# Patient Record
Sex: Female | Born: 1960 | Race: White | Hispanic: No | Marital: Married | Smoking: Never smoker
Health system: Southern US, Community
[De-identification: ages and names within clinical notes are randomized; demographics above are authoritative.]

## PROBLEM LIST (undated history)

## (undated) DIAGNOSIS — F419 Anxiety disorder, unspecified: Secondary | ICD-10-CM

## (undated) DIAGNOSIS — M1990 Unspecified osteoarthritis, unspecified site: Secondary | ICD-10-CM

## (undated) DIAGNOSIS — K219 Gastro-esophageal reflux disease without esophagitis: Secondary | ICD-10-CM

## (undated) DIAGNOSIS — G4730 Sleep apnea, unspecified: Secondary | ICD-10-CM

## (undated) DIAGNOSIS — T7840XA Allergy, unspecified, initial encounter: Secondary | ICD-10-CM

## (undated) DIAGNOSIS — I10 Essential (primary) hypertension: Secondary | ICD-10-CM

## (undated) DIAGNOSIS — F32A Depression, unspecified: Secondary | ICD-10-CM

## (undated) HISTORY — PX: ORIF DISTAL RADIUS FRACTURE: SUR927

## (undated) HISTORY — PX: WRIST FRACTURE SURGERY: SHX121

## (undated) HISTORY — DX: Anxiety disorder, unspecified: F41.9

## (undated) HISTORY — PX: HERNIA REPAIR: SHX51

## (undated) HISTORY — PX: OTHER SURGICAL HISTORY: SHX169

## (undated) HISTORY — PX: BUNIONECTOMY: SHX129

## (undated) HISTORY — PX: HYSTERECTOMY ABDOMINAL WITH SALPINGECTOMY: SHX6725

## (undated) HISTORY — DX: Allergy, unspecified, initial encounter: T78.40XA

## (undated) HISTORY — DX: Sleep apnea, unspecified: G47.30

---

## 2002-07-31 ENCOUNTER — Encounter: Admission: RE | Admit: 2002-07-31 | Discharge: 2002-07-31 | Payer: Self-pay | Admitting: Obstetrics and Gynecology

## 2002-07-31 ENCOUNTER — Encounter: Payer: Self-pay | Admitting: Obstetrics and Gynecology

## 2002-09-27 ENCOUNTER — Encounter: Payer: Self-pay | Admitting: Surgery

## 2002-09-27 ENCOUNTER — Encounter: Admission: RE | Admit: 2002-09-27 | Discharge: 2002-09-27 | Payer: Self-pay | Admitting: Surgery

## 2009-03-12 ENCOUNTER — Encounter: Admission: RE | Admit: 2009-03-12 | Discharge: 2009-03-12 | Payer: Self-pay | Admitting: Obstetrics and Gynecology

## 2010-03-14 ENCOUNTER — Encounter: Admission: RE | Admit: 2010-03-14 | Discharge: 2010-03-14 | Payer: Self-pay | Admitting: Obstetrics and Gynecology

## 2011-02-23 ENCOUNTER — Other Ambulatory Visit: Payer: Self-pay | Admitting: Obstetrics and Gynecology

## 2011-02-23 DIAGNOSIS — Z1231 Encounter for screening mammogram for malignant neoplasm of breast: Secondary | ICD-10-CM

## 2011-03-20 ENCOUNTER — Ambulatory Visit
Admission: RE | Admit: 2011-03-20 | Discharge: 2011-03-20 | Disposition: A | Discharge status: Home / Self Care | Payer: BC Managed Care – PPO | Source: Ambulatory Visit | Attending: Obstetrics and Gynecology | Admitting: Obstetrics and Gynecology

## 2011-03-20 DIAGNOSIS — Z1231 Encounter for screening mammogram for malignant neoplasm of breast: Secondary | ICD-10-CM

## 2012-02-26 ENCOUNTER — Other Ambulatory Visit: Payer: Self-pay | Admitting: Obstetrics and Gynecology

## 2012-02-26 DIAGNOSIS — Z1231 Encounter for screening mammogram for malignant neoplasm of breast: Secondary | ICD-10-CM

## 2012-03-24 ENCOUNTER — Ambulatory Visit
Admission: RE | Admit: 2012-03-24 | Discharge: 2012-03-24 | Disposition: A | Discharge status: Home / Self Care | Payer: BC Managed Care – PPO | Source: Ambulatory Visit | Attending: Obstetrics and Gynecology | Admitting: Obstetrics and Gynecology

## 2012-03-24 DIAGNOSIS — Z1231 Encounter for screening mammogram for malignant neoplasm of breast: Secondary | ICD-10-CM

## 2014-01-09 ENCOUNTER — Other Ambulatory Visit: Payer: Self-pay | Admitting: Family Medicine

## 2014-01-09 DIAGNOSIS — Z1231 Encounter for screening mammogram for malignant neoplasm of breast: Secondary | ICD-10-CM

## 2014-01-19 ENCOUNTER — Ambulatory Visit: Payer: BC Managed Care – PPO

## 2014-01-26 ENCOUNTER — Ambulatory Visit
Admission: RE | Admit: 2014-01-26 | Discharge: 2014-01-26 | Disposition: A | Discharge status: Home / Self Care | Payer: BC Managed Care – PPO | Source: Ambulatory Visit | Attending: Family Medicine | Admitting: Family Medicine

## 2014-01-26 ENCOUNTER — Other Ambulatory Visit: Payer: Self-pay

## 2014-01-26 ENCOUNTER — Ambulatory Visit
Admission: RE | Admit: 2014-01-26 | Discharge: 2014-01-26 | Disposition: A | Discharge status: Home / Self Care | Payer: BC Managed Care – PPO | Source: Ambulatory Visit

## 2014-01-26 DIAGNOSIS — Z1231 Encounter for screening mammogram for malignant neoplasm of breast: Secondary | ICD-10-CM

## 2018-10-27 ENCOUNTER — Other Ambulatory Visit: Payer: Self-pay | Admitting: Family Medicine

## 2018-10-27 DIAGNOSIS — Z1231 Encounter for screening mammogram for malignant neoplasm of breast: Secondary | ICD-10-CM

## 2018-12-13 ENCOUNTER — Ambulatory Visit
Admission: RE | Admit: 2018-12-13 | Discharge: 2018-12-13 | Disposition: A | Discharge status: Home / Self Care | Payer: BC Managed Care – PPO | Source: Ambulatory Visit | Attending: Family Medicine | Admitting: Family Medicine

## 2018-12-13 ENCOUNTER — Other Ambulatory Visit: Payer: Self-pay

## 2018-12-13 ENCOUNTER — Other Ambulatory Visit: Payer: Self-pay | Admitting: Family Medicine

## 2018-12-13 DIAGNOSIS — N63 Unspecified lump in unspecified breast: Secondary | ICD-10-CM

## 2018-12-13 DIAGNOSIS — Z1231 Encounter for screening mammogram for malignant neoplasm of breast: Secondary | ICD-10-CM

## 2018-12-22 ENCOUNTER — Other Ambulatory Visit: Payer: Self-pay

## 2018-12-22 ENCOUNTER — Ambulatory Visit: Payer: BC Managed Care – PPO

## 2018-12-22 ENCOUNTER — Ambulatory Visit
Admission: RE | Admit: 2018-12-22 | Discharge: 2018-12-22 | Disposition: A | Discharge status: Home / Self Care | Payer: BC Managed Care – PPO | Source: Ambulatory Visit | Attending: Family Medicine | Admitting: Family Medicine

## 2018-12-22 DIAGNOSIS — N63 Unspecified lump in unspecified breast: Secondary | ICD-10-CM

## 2019-11-28 ENCOUNTER — Other Ambulatory Visit: Payer: Self-pay | Admitting: Family Medicine

## 2019-11-28 DIAGNOSIS — Z1231 Encounter for screening mammogram for malignant neoplasm of breast: Secondary | ICD-10-CM

## 2019-12-25 ENCOUNTER — Ambulatory Visit: Payer: BC Managed Care – PPO

## 2020-01-03 ENCOUNTER — Other Ambulatory Visit: Payer: Self-pay

## 2020-01-03 ENCOUNTER — Ambulatory Visit
Admission: RE | Admit: 2020-01-03 | Discharge: 2020-01-03 | Disposition: A | Discharge status: Home / Self Care | Payer: BC Managed Care – PPO | Source: Ambulatory Visit | Attending: Family Medicine | Admitting: Family Medicine

## 2020-01-03 DIAGNOSIS — Z1231 Encounter for screening mammogram for malignant neoplasm of breast: Secondary | ICD-10-CM

## 2021-11-24 IMAGING — MG DIGITAL SCREENING BILAT W/ TOMO W/ CAD
8 series · 8 of 24 positions shown · non-contrast
Comparison: Previous exam(s).

CLINICAL DATA: Screening.

EXAM:
DIGITAL SCREENING BILATERAL MAMMOGRAM WITH TOMO AND CAD

[L MLO synth-2D]
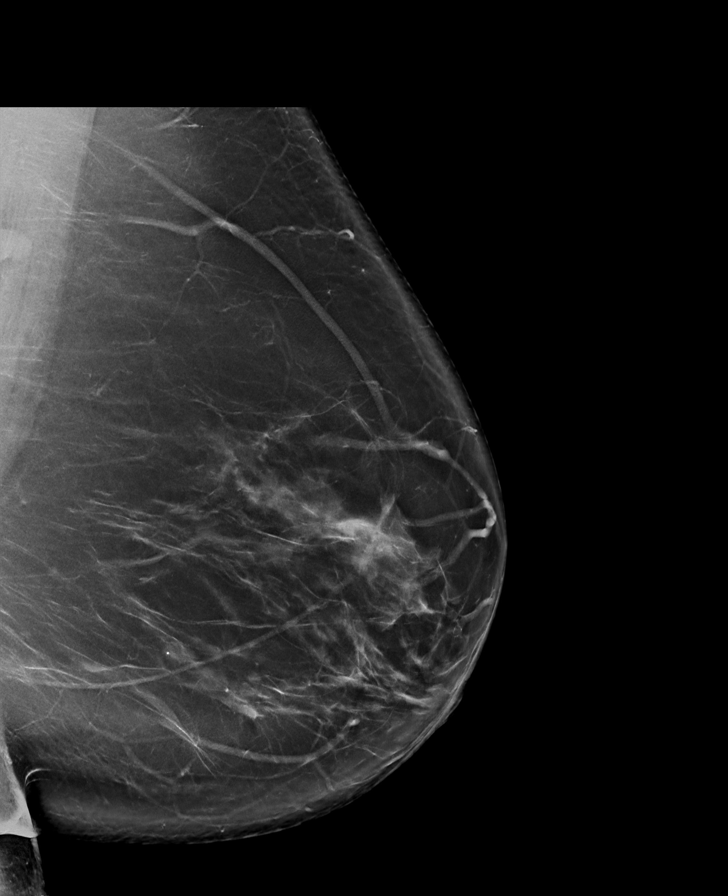

[R CC synth-2D]
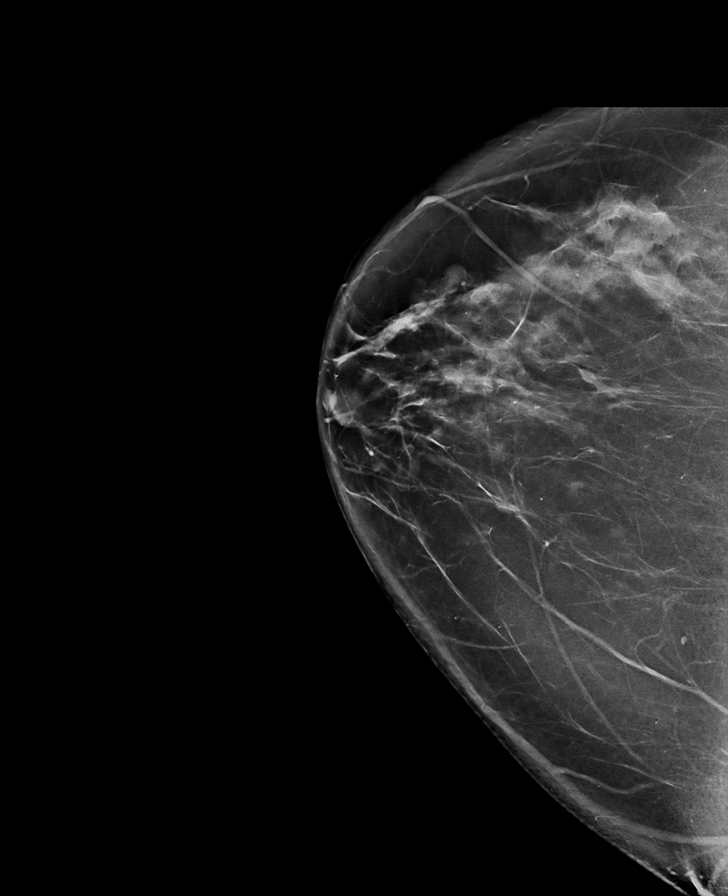

[R MLO synth-2D]
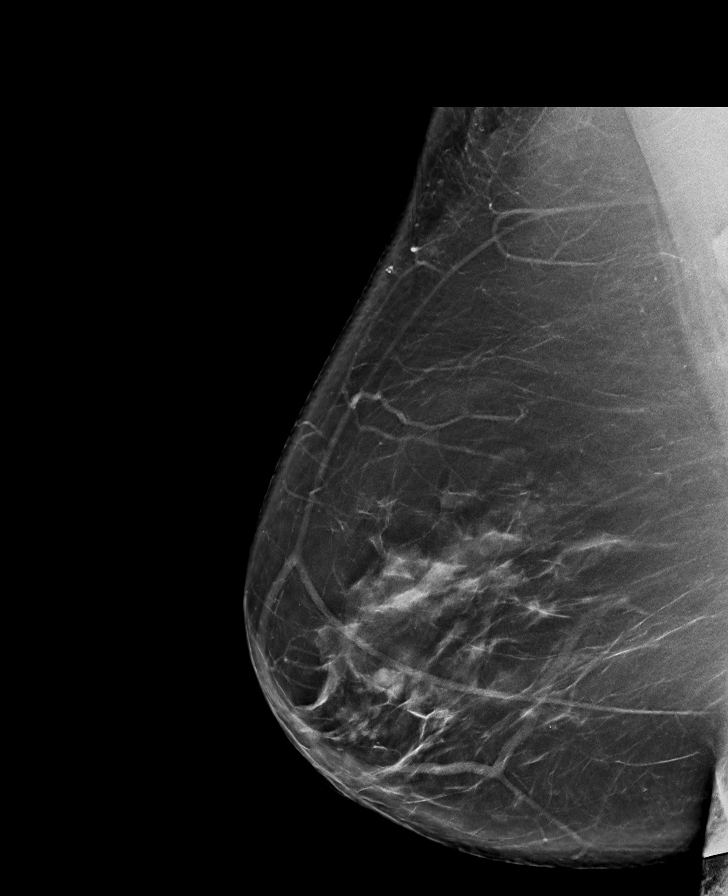

[L CC synth-2D]
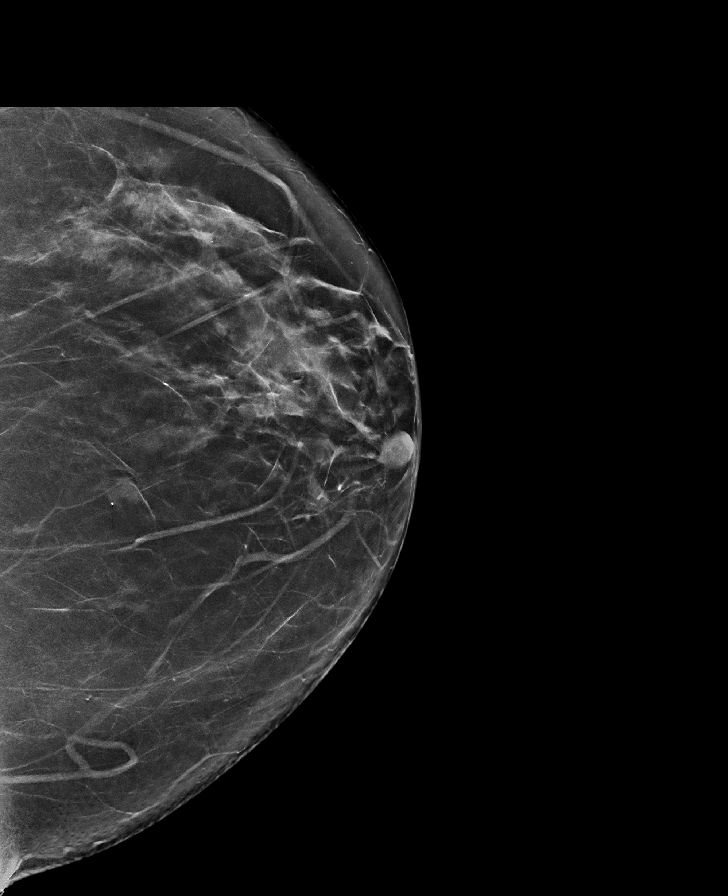

[L MLO tomo · tomo slice 51/101.0]
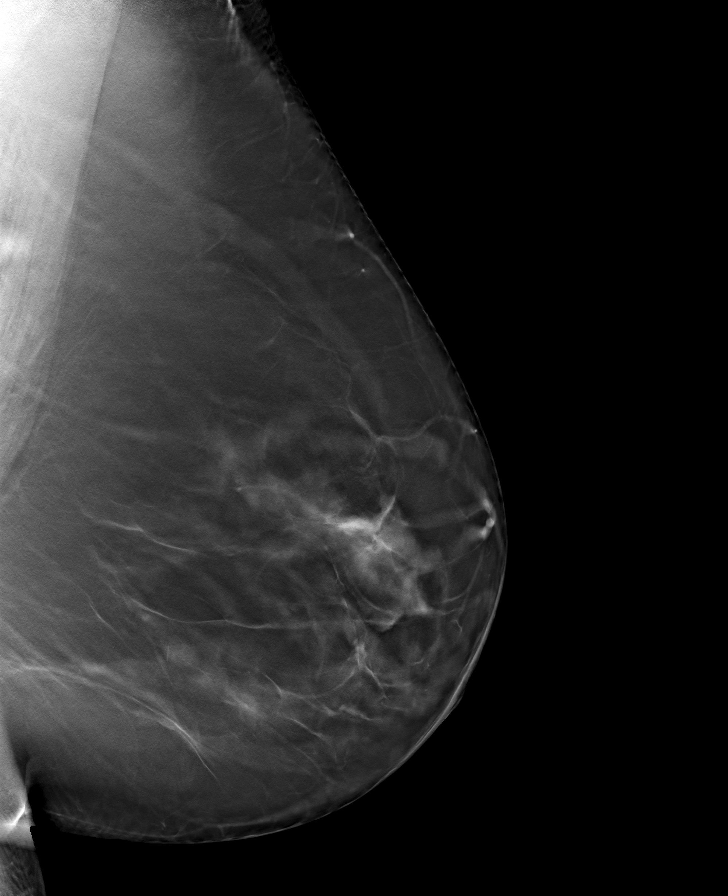

[L CC tomo · tomo slice 43/84.0]
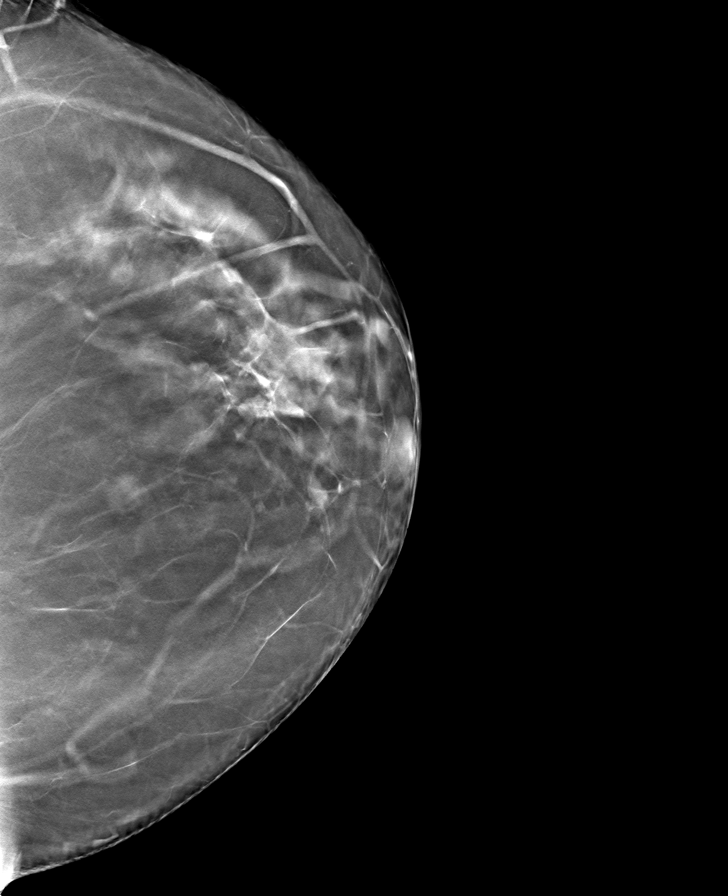

[R MLO tomo · tomo slice 51/100.0]
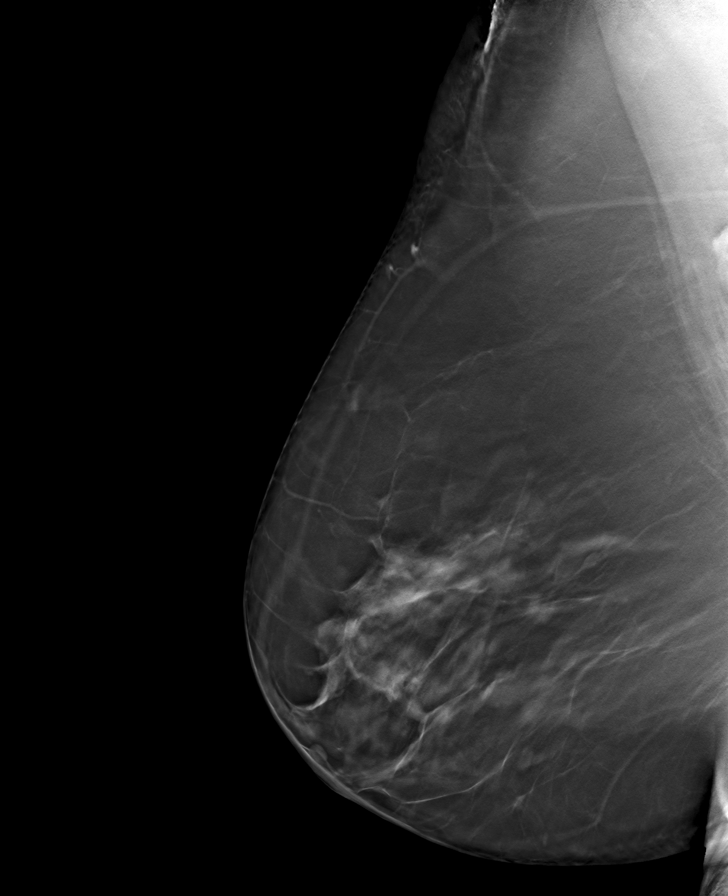

[R CC tomo · tomo slice 43/86.0]
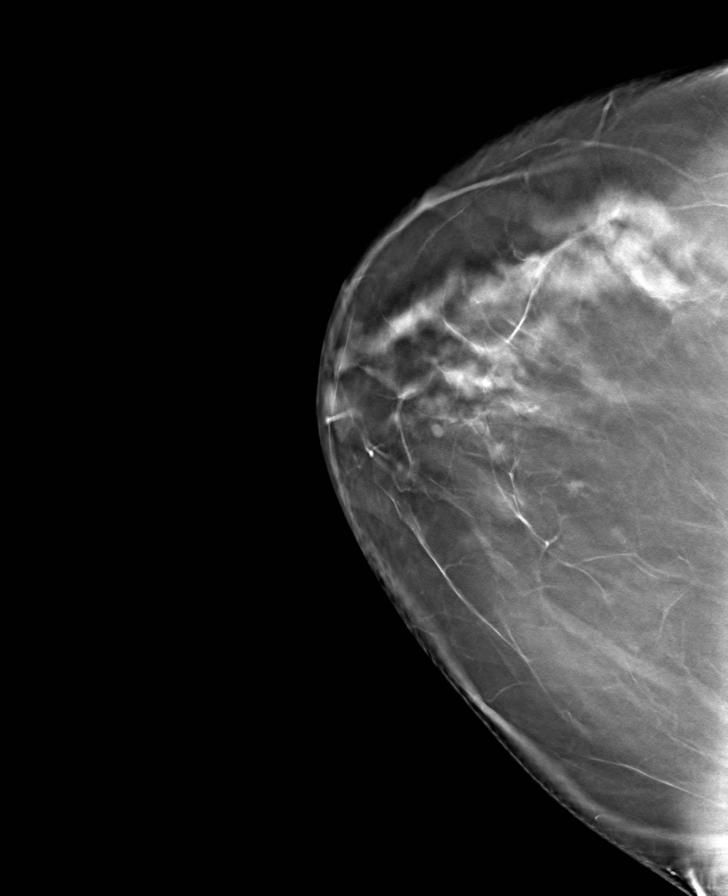

[8 of 24 positions shown; findings below may reference images not displayed]

ACR Breast Density Category c: The breast tissue is heterogeneously
dense, which may obscure small masses.
FINDINGS: There are no findings suspicious for malignancy. Images were
processed with CAD.
IMPRESSION: No mammographic evidence of malignancy. A result letter of this
screening mammogram will be mailed directly to the patient.

RECOMMENDATION:
Screening mammogram in one year. (Code:[id number]-[id number])

BI-RADS CATEGORY  1: Negative.

## 2022-02-25 DIAGNOSIS — R7301 Impaired fasting glucose: Secondary | ICD-10-CM | POA: Diagnosis not present

## 2022-02-25 DIAGNOSIS — L0390 Cellulitis, unspecified: Secondary | ICD-10-CM | POA: Diagnosis not present

## 2022-02-25 DIAGNOSIS — I10 Essential (primary) hypertension: Secondary | ICD-10-CM | POA: Diagnosis not present

## 2022-02-25 DIAGNOSIS — Z6841 Body Mass Index (BMI) 40.0 and over, adult: Secondary | ICD-10-CM | POA: Diagnosis not present

## 2022-02-25 DIAGNOSIS — F329 Major depressive disorder, single episode, unspecified: Secondary | ICD-10-CM | POA: Diagnosis not present

## 2022-02-25 DIAGNOSIS — E7800 Pure hypercholesterolemia, unspecified: Secondary | ICD-10-CM | POA: Diagnosis not present

## 2022-02-25 DIAGNOSIS — G2581 Restless legs syndrome: Secondary | ICD-10-CM | POA: Diagnosis not present

## 2022-04-01 DIAGNOSIS — K5900 Constipation, unspecified: Secondary | ICD-10-CM | POA: Diagnosis not present

## 2022-04-01 DIAGNOSIS — G2581 Restless legs syndrome: Secondary | ICD-10-CM | POA: Diagnosis not present

## 2022-04-01 DIAGNOSIS — F419 Anxiety disorder, unspecified: Secondary | ICD-10-CM | POA: Diagnosis not present

## 2022-04-01 DIAGNOSIS — I10 Essential (primary) hypertension: Secondary | ICD-10-CM | POA: Diagnosis not present

## 2022-04-01 DIAGNOSIS — F330 Major depressive disorder, recurrent, mild: Secondary | ICD-10-CM | POA: Diagnosis not present

## 2022-04-01 DIAGNOSIS — J309 Allergic rhinitis, unspecified: Secondary | ICD-10-CM | POA: Diagnosis not present

## 2022-04-01 DIAGNOSIS — G8929 Other chronic pain: Secondary | ICD-10-CM | POA: Diagnosis not present

## 2022-04-01 DIAGNOSIS — L0390 Cellulitis, unspecified: Secondary | ICD-10-CM | POA: Diagnosis not present

## 2022-04-01 DIAGNOSIS — K219 Gastro-esophageal reflux disease without esophagitis: Secondary | ICD-10-CM | POA: Diagnosis not present

## 2022-04-01 DIAGNOSIS — K589 Irritable bowel syndrome without diarrhea: Secondary | ICD-10-CM | POA: Diagnosis not present

## 2022-04-01 DIAGNOSIS — Z6841 Body Mass Index (BMI) 40.0 and over, adult: Secondary | ICD-10-CM | POA: Diagnosis not present

## 2022-04-02 DIAGNOSIS — L0390 Cellulitis, unspecified: Secondary | ICD-10-CM | POA: Diagnosis not present

## 2022-04-13 DIAGNOSIS — Z2821 Immunization not carried out because of patient refusal: Secondary | ICD-10-CM | POA: Diagnosis not present

## 2022-04-13 DIAGNOSIS — L0390 Cellulitis, unspecified: Secondary | ICD-10-CM | POA: Diagnosis not present

## 2022-04-14 DIAGNOSIS — M1711 Unilateral primary osteoarthritis, right knee: Secondary | ICD-10-CM | POA: Diagnosis not present

## 2022-04-22 DIAGNOSIS — J0100 Acute maxillary sinusitis, unspecified: Secondary | ICD-10-CM | POA: Diagnosis not present

## 2022-05-13 DIAGNOSIS — S60222A Contusion of left hand, initial encounter: Secondary | ICD-10-CM | POA: Diagnosis not present

## 2022-05-13 DIAGNOSIS — S52501A Unspecified fracture of the lower end of right radius, initial encounter for closed fracture: Secondary | ICD-10-CM | POA: Diagnosis not present

## 2022-05-21 DIAGNOSIS — S52501A Unspecified fracture of the lower end of right radius, initial encounter for closed fracture: Secondary | ICD-10-CM | POA: Diagnosis not present

## 2022-05-21 DIAGNOSIS — S52571A Other intraarticular fracture of lower end of right radius, initial encounter for closed fracture: Secondary | ICD-10-CM | POA: Diagnosis not present

## 2022-05-26 DIAGNOSIS — S60222A Contusion of left hand, initial encounter: Secondary | ICD-10-CM | POA: Diagnosis not present

## 2022-05-28 DIAGNOSIS — Z9889 Other specified postprocedural states: Secondary | ICD-10-CM | POA: Diagnosis not present

## 2022-05-28 DIAGNOSIS — G8918 Other acute postprocedural pain: Secondary | ICD-10-CM | POA: Diagnosis not present

## 2022-05-28 DIAGNOSIS — K219 Gastro-esophageal reflux disease without esophagitis: Secondary | ICD-10-CM | POA: Diagnosis not present

## 2022-05-28 DIAGNOSIS — I10 Essential (primary) hypertension: Secondary | ICD-10-CM | POA: Diagnosis not present

## 2022-05-28 DIAGNOSIS — S52571A Other intraarticular fracture of lower end of right radius, initial encounter for closed fracture: Secondary | ICD-10-CM | POA: Diagnosis not present

## 2022-05-28 DIAGNOSIS — S52591A Other fractures of lower end of right radius, initial encounter for closed fracture: Secondary | ICD-10-CM | POA: Diagnosis not present

## 2022-05-28 DIAGNOSIS — E669 Obesity, unspecified: Secondary | ICD-10-CM | POA: Diagnosis not present

## 2022-06-08 DIAGNOSIS — S52501A Unspecified fracture of the lower end of right radius, initial encounter for closed fracture: Secondary | ICD-10-CM | POA: Diagnosis not present

## 2022-06-10 DIAGNOSIS — Z6841 Body Mass Index (BMI) 40.0 and over, adult: Secondary | ICD-10-CM | POA: Diagnosis not present

## 2022-06-10 DIAGNOSIS — I10 Essential (primary) hypertension: Secondary | ICD-10-CM | POA: Diagnosis not present

## 2022-06-10 DIAGNOSIS — K219 Gastro-esophageal reflux disease without esophagitis: Secondary | ICD-10-CM | POA: Diagnosis not present

## 2022-06-10 DIAGNOSIS — E7800 Pure hypercholesterolemia, unspecified: Secondary | ICD-10-CM | POA: Diagnosis not present

## 2022-06-10 DIAGNOSIS — G2581 Restless legs syndrome: Secondary | ICD-10-CM | POA: Diagnosis not present

## 2022-06-10 DIAGNOSIS — F329 Major depressive disorder, single episode, unspecified: Secondary | ICD-10-CM | POA: Diagnosis not present

## 2022-07-06 DIAGNOSIS — S52501A Unspecified fracture of the lower end of right radius, initial encounter for closed fracture: Secondary | ICD-10-CM | POA: Diagnosis not present

## 2022-07-10 DIAGNOSIS — M189 Osteoarthritis of first carpometacarpal joint, unspecified: Secondary | ICD-10-CM | POA: Diagnosis not present

## 2022-07-10 DIAGNOSIS — S52591A Other fractures of lower end of right radius, initial encounter for closed fracture: Secondary | ICD-10-CM | POA: Diagnosis not present

## 2022-07-10 DIAGNOSIS — S52501A Unspecified fracture of the lower end of right radius, initial encounter for closed fracture: Secondary | ICD-10-CM | POA: Diagnosis not present

## 2022-08-05 DIAGNOSIS — Z1389 Encounter for screening for other disorder: Secondary | ICD-10-CM | POA: Diagnosis not present

## 2022-08-05 DIAGNOSIS — Z90710 Acquired absence of both cervix and uterus: Secondary | ICD-10-CM | POA: Diagnosis not present

## 2022-08-05 DIAGNOSIS — K760 Fatty (change of) liver, not elsewhere classified: Secondary | ICD-10-CM | POA: Diagnosis not present

## 2022-08-05 DIAGNOSIS — Z0000 Encounter for general adult medical examination without abnormal findings: Secondary | ICD-10-CM | POA: Diagnosis not present

## 2022-08-05 DIAGNOSIS — F330 Major depressive disorder, recurrent, mild: Secondary | ICD-10-CM | POA: Diagnosis not present

## 2022-08-05 DIAGNOSIS — Z6841 Body Mass Index (BMI) 40.0 and over, adult: Secondary | ICD-10-CM | POA: Diagnosis not present

## 2022-08-05 DIAGNOSIS — Z9181 History of falling: Secondary | ICD-10-CM | POA: Diagnosis not present

## 2022-08-05 DIAGNOSIS — R600 Localized edema: Secondary | ICD-10-CM | POA: Diagnosis not present

## 2022-08-05 DIAGNOSIS — Z2821 Immunization not carried out because of patient refusal: Secondary | ICD-10-CM | POA: Diagnosis not present

## 2022-08-06 ENCOUNTER — Other Ambulatory Visit: Payer: Self-pay | Admitting: Family Medicine

## 2022-08-06 DIAGNOSIS — Z1231 Encounter for screening mammogram for malignant neoplasm of breast: Secondary | ICD-10-CM

## 2022-08-07 ENCOUNTER — Ambulatory Visit
Admission: RE | Admit: 2022-08-07 | Discharge: 2022-08-07 | Disposition: A | Discharge status: Home / Self Care | Payer: BC Managed Care – PPO | Source: Ambulatory Visit | Attending: Family Medicine | Admitting: Family Medicine

## 2022-08-07 DIAGNOSIS — Z1231 Encounter for screening mammogram for malignant neoplasm of breast: Secondary | ICD-10-CM | POA: Diagnosis not present

## 2022-08-10 DIAGNOSIS — S52501A Unspecified fracture of the lower end of right radius, initial encounter for closed fracture: Secondary | ICD-10-CM | POA: Diagnosis not present

## 2022-08-17 DIAGNOSIS — M1711 Unilateral primary osteoarthritis, right knee: Secondary | ICD-10-CM | POA: Diagnosis not present

## 2022-08-19 DIAGNOSIS — S52601A Unspecified fracture of lower end of right ulna, initial encounter for closed fracture: Secondary | ICD-10-CM | POA: Diagnosis not present

## 2022-08-19 DIAGNOSIS — S52501A Unspecified fracture of the lower end of right radius, initial encounter for closed fracture: Secondary | ICD-10-CM | POA: Diagnosis not present

## 2022-08-27 ENCOUNTER — Encounter (HOSPITAL_BASED_OUTPATIENT_CLINIC_OR_DEPARTMENT_OTHER): Payer: Self-pay | Admitting: Orthopedic Surgery

## 2022-08-28 ENCOUNTER — Other Ambulatory Visit: Payer: Self-pay

## 2022-08-28 ENCOUNTER — Encounter (HOSPITAL_BASED_OUTPATIENT_CLINIC_OR_DEPARTMENT_OTHER): Payer: Self-pay | Admitting: Orthopedic Surgery

## 2022-08-31 ENCOUNTER — Encounter (HOSPITAL_BASED_OUTPATIENT_CLINIC_OR_DEPARTMENT_OTHER)
Admission: RE | Admit: 2022-08-31 | Discharge: 2022-08-31 | Disposition: A | Discharge status: Home / Self Care | Payer: PPO | Source: Ambulatory Visit | Attending: Orthopedic Surgery | Admitting: Orthopedic Surgery

## 2022-08-31 DIAGNOSIS — Z01810 Encounter for preprocedural cardiovascular examination: Secondary | ICD-10-CM | POA: Diagnosis not present

## 2022-08-31 DIAGNOSIS — I10 Essential (primary) hypertension: Secondary | ICD-10-CM | POA: Diagnosis not present

## 2022-08-31 NOTE — Progress Notes (Signed)

## 2022-09-01 ENCOUNTER — Other Ambulatory Visit: Payer: Self-pay | Admitting: Orthopedic Surgery

## 2022-09-03 ENCOUNTER — Encounter (HOSPITAL_BASED_OUTPATIENT_CLINIC_OR_DEPARTMENT_OTHER)
Admission: RE | Disposition: A | Discharge status: Home / Self Care | Payer: Self-pay | Source: Home / Self Care | Attending: Orthopedic Surgery

## 2022-09-03 ENCOUNTER — Ambulatory Visit (HOSPITAL_BASED_OUTPATIENT_CLINIC_OR_DEPARTMENT_OTHER)
Admission: RE | Admit: 2022-09-03 | Discharge: 2022-09-03 | Disposition: A | Discharge status: Home / Self Care | Payer: PPO | Attending: Orthopedic Surgery | Admitting: Orthopedic Surgery

## 2022-09-03 ENCOUNTER — Other Ambulatory Visit: Payer: Self-pay

## 2022-09-03 ENCOUNTER — Ambulatory Visit (HOSPITAL_BASED_OUTPATIENT_CLINIC_OR_DEPARTMENT_OTHER): Payer: PPO | Admitting: Anesthesiology

## 2022-09-03 ENCOUNTER — Encounter (HOSPITAL_BASED_OUTPATIENT_CLINIC_OR_DEPARTMENT_OTHER): Payer: Self-pay | Admitting: Orthopedic Surgery

## 2022-09-03 ENCOUNTER — Ambulatory Visit (HOSPITAL_BASED_OUTPATIENT_CLINIC_OR_DEPARTMENT_OTHER): Payer: PPO

## 2022-09-03 DIAGNOSIS — M1990 Unspecified osteoarthritis, unspecified site: Secondary | ICD-10-CM | POA: Diagnosis not present

## 2022-09-03 DIAGNOSIS — I10 Essential (primary) hypertension: Secondary | ICD-10-CM

## 2022-09-03 DIAGNOSIS — Z791 Long term (current) use of non-steroidal anti-inflammatories (NSAID): Secondary | ICD-10-CM | POA: Diagnosis not present

## 2022-09-03 DIAGNOSIS — S52501P Unspecified fracture of the lower end of right radius, subsequent encounter for closed fracture with malunion: Secondary | ICD-10-CM | POA: Diagnosis not present

## 2022-09-03 DIAGNOSIS — K219 Gastro-esophageal reflux disease without esophagitis: Secondary | ICD-10-CM | POA: Insufficient documentation

## 2022-09-03 DIAGNOSIS — X58XXXD Exposure to other specified factors, subsequent encounter: Secondary | ICD-10-CM | POA: Insufficient documentation

## 2022-09-03 DIAGNOSIS — S52501K Unspecified fracture of the lower end of right radius, subsequent encounter for closed fracture with nonunion: Secondary | ICD-10-CM

## 2022-09-03 DIAGNOSIS — Z79899 Other long term (current) drug therapy: Secondary | ICD-10-CM | POA: Insufficient documentation

## 2022-09-03 DIAGNOSIS — Z472 Encounter for removal of internal fixation device: Secondary | ICD-10-CM

## 2022-09-03 DIAGNOSIS — F32A Depression, unspecified: Secondary | ICD-10-CM | POA: Diagnosis not present

## 2022-09-03 DIAGNOSIS — S52571P Other intraarticular fracture of lower end of right radius, subsequent encounter for closed fracture with malunion: Secondary | ICD-10-CM | POA: Diagnosis not present

## 2022-09-03 HISTORY — PX: HARDWARE REMOVAL: SHX979

## 2022-09-03 HISTORY — DX: Unspecified osteoarthritis, unspecified site: M19.90

## 2022-09-03 HISTORY — PX: WRIST OSTEOTOMY: SHX1093

## 2022-09-03 HISTORY — DX: Gastro-esophageal reflux disease without esophagitis: K21.9

## 2022-09-03 HISTORY — DX: Depression, unspecified: F32.A

## 2022-09-03 HISTORY — DX: Essential (primary) hypertension: I10

## 2022-09-03 SURGERY — REMOVAL, HARDWARE
Anesthesia: Monitor Anesthesia Care | Site: Wrist | Laterality: Right

## 2022-09-03 MED ORDER — AMISULPRIDE (ANTIEMETIC) 5 MG/2ML IV SOLN: 10.0000 mg | Freq: Once | INTRAVENOUS | Status: DC | PRN

## 2022-09-03 MED ORDER — BUPIVACAINE HCL (PF) 0.25 % IJ SOLN
INTRAMUSCULAR | Status: AC
  Filled 2022-09-03: qty 60

## 2022-09-03 MED ORDER — OXYCODONE HCL 5 MG PO TABS: 5.0000 mg | ORAL_TABLET | Freq: Once | ORAL | Status: DC | PRN

## 2022-09-03 MED ORDER — LIDOCAINE 2% (20 MG/ML) 5 ML SYRINGE
INTRAMUSCULAR | Status: AC
  Filled 2022-09-03: qty 5

## 2022-09-03 MED ORDER — PROPOFOL 10 MG/ML IV BOLUS
INTRAVENOUS | Status: DC | PRN
  Administered 2022-09-03 (×2): 20 mg via INTRAVENOUS

## 2022-09-03 MED ORDER — FENTANYL CITRATE (PF) 100 MCG/2ML IJ SOLN
100.0000 ug | Freq: Once | INTRAMUSCULAR | Status: AC
  Administered 2022-09-03: 50 ug via INTRAVENOUS

## 2022-09-03 MED ORDER — OXYCODONE HCL 5 MG/5ML PO SOLN: 5.0000 mg | Freq: Once | ORAL | Status: DC | PRN

## 2022-09-03 MED ORDER — CLONIDINE HCL (ANALGESIA) 100 MCG/ML EP SOLN
EPIDURAL | Status: DC | PRN
  Administered 2022-09-03: 50 ug

## 2022-09-03 MED ORDER — FENTANYL CITRATE (PF) 100 MCG/2ML IJ SOLN: 25.0000 ug | INTRAMUSCULAR | Status: DC | PRN

## 2022-09-03 MED ORDER — CEFAZOLIN SODIUM-DEXTROSE 2-4 GM/100ML-% IV SOLN: 2.0000 g | INTRAVENOUS | Status: DC

## 2022-09-03 MED ORDER — FENTANYL CITRATE (PF) 100 MCG/2ML IJ SOLN
INTRAMUSCULAR | Status: AC
  Filled 2022-09-03: qty 2

## 2022-09-03 MED ORDER — ONDANSETRON HCL 4 MG/2ML IJ SOLN
INTRAMUSCULAR | Status: DC | PRN
  Administered 2022-09-03: 4 mg via INTRAVENOUS

## 2022-09-03 MED ORDER — OXYCODONE-ACETAMINOPHEN 5-325 MG PO TABS: ORAL_TABLET | ORAL | 0 refills | Status: DC

## 2022-09-03 MED ORDER — LACTATED RINGERS IV SOLN: INTRAVENOUS | Status: DC

## 2022-09-03 MED ORDER — 0.9 % SODIUM CHLORIDE (POUR BTL) OPTIME
TOPICAL | Status: DC | PRN
  Administered 2022-09-03: 1000 mL

## 2022-09-03 MED ORDER — ACETAMINOPHEN 10 MG/ML IV SOLN
INTRAVENOUS | Status: DC | PRN
  Administered 2022-09-03: 1000 mg via INTRAVENOUS

## 2022-09-03 MED ORDER — CEFAZOLIN SODIUM-DEXTROSE 2-4 GM/100ML-% IV SOLN
INTRAVENOUS | Status: AC
  Filled 2022-09-03: qty 100

## 2022-09-03 MED ORDER — ACETAMINOPHEN 500 MG PO TABS
ORAL_TABLET | ORAL | Status: AC
  Filled 2022-09-03: qty 2

## 2022-09-03 MED ORDER — MIDAZOLAM HCL 2 MG/2ML IJ SOLN
INTRAMUSCULAR | Status: AC
  Filled 2022-09-03: qty 2

## 2022-09-03 MED ORDER — MIDAZOLAM HCL 2 MG/2ML IJ SOLN
2.0000 mg | Freq: Once | INTRAMUSCULAR | Status: AC
  Administered 2022-09-03: 2 mg via INTRAVENOUS

## 2022-09-03 MED ORDER — PROPOFOL 500 MG/50ML IV EMUL
INTRAVENOUS | Status: DC | PRN
  Administered 2022-09-03: 75 ug/kg/min via INTRAVENOUS

## 2022-09-03 MED ORDER — BUPIVACAINE-EPINEPHRINE (PF) 0.5% -1:200000 IJ SOLN
INTRAMUSCULAR | Status: DC | PRN
  Administered 2022-09-03: 30 mL via PERINEURAL

## 2022-09-03 MED ORDER — LIDOCAINE 2% (20 MG/ML) 5 ML SYRINGE
INTRAMUSCULAR | Status: DC | PRN
  Administered 2022-09-03: 20 mg via INTRAVENOUS

## 2022-09-03 MED ORDER — ACETAMINOPHEN 10 MG/ML IV SOLN
INTRAVENOUS | Status: AC
  Filled 2022-09-03: qty 100

## 2022-09-03 MED ORDER — ONDANSETRON HCL 4 MG/2ML IJ SOLN
INTRAMUSCULAR | Status: AC
  Filled 2022-09-03: qty 2

## 2022-09-03 MED ORDER — DEXTROSE 5 % IV SOLN
INTRAVENOUS | Status: DC | PRN
  Administered 2022-09-03: 3 g via INTRAVENOUS

## 2022-09-03 MED ORDER — ACETAMINOPHEN 500 MG PO TABS: 1000.0000 mg | ORAL_TABLET | Freq: Once | ORAL | Status: DC

## 2022-09-03 SURGICAL SUPPLY — 62 items
APL PRP STRL LF DISP 70% ISPRP (MISCELLANEOUS) ×1
BIT DRILL 2.0 LNG QUCK RELEASE (BIT) IMPLANT
BIT DRILL QC 2.8X5 (BIT) IMPLANT
BLADE MINI RND TIP GREEN BEAV (BLADE) IMPLANT
BLADE SURG 15 STRL LF DISP TIS (BLADE) ×2 IMPLANT
BLADE SURG 15 STRL SS (BLADE) ×2
BNDG CMPR 5X2 KNTD ELC UNQ LF (GAUZE/BANDAGES/DRESSINGS)
BNDG CMPR 5X3 KNIT ELC UNQ LF (GAUZE/BANDAGES/DRESSINGS) ×1
BNDG CMPR 9X4 STRL LF SNTH (GAUZE/BANDAGES/DRESSINGS) ×1
BNDG COHESIVE 1X5 TAN STRL LF (GAUZE/BANDAGES/DRESSINGS) IMPLANT
BNDG ELASTIC 2INX 5YD STR LF (GAUZE/BANDAGES/DRESSINGS) IMPLANT
BNDG ELASTIC 3INX 5YD STR LF (GAUZE/BANDAGES/DRESSINGS) ×1 IMPLANT
BNDG ESMARK 4X9 LF (GAUZE/BANDAGES/DRESSINGS) IMPLANT
BNDG GAUZE DERMACEA FLUFF 4 (GAUZE/BANDAGES/DRESSINGS) ×1 IMPLANT
BNDG GZE DERMACEA 4 6PLY (GAUZE/BANDAGES/DRESSINGS) ×1
BONE CHIP PRESERV 5CC PCAN5 (Bone Implant) ×1 IMPLANT
CHLORAPREP W/TINT 26 (MISCELLANEOUS) ×1 IMPLANT
CORD BIPOLAR FORCEPS 12FT (ELECTRODE) ×1 IMPLANT
COVER BACK TABLE 60X90IN (DRAPES) ×1 IMPLANT
COVER MAYO STAND STRL (DRAPES) ×1 IMPLANT
CUFF TOURN SGL QUICK 18X4 (TOURNIQUET CUFF) ×1 IMPLANT
DRAPE EXTREMITY T 121X128X90 (DISPOSABLE) ×1 IMPLANT
DRAPE SURG 17X23 STRL (DRAPES) ×1 IMPLANT
DRILL 2.0 LNG QUICK RELEASE (BIT) ×1
GAUZE PAD ABD 8X10 STRL (GAUZE/BANDAGES/DRESSINGS) IMPLANT
GAUZE SPONGE 4X4 12PLY STRL (GAUZE/BANDAGES/DRESSINGS) ×1 IMPLANT
GAUZE XEROFORM 1X8 LF (GAUZE/BANDAGES/DRESSINGS) ×1 IMPLANT
GLOVE BIO SURGEON STRL SZ7.5 (GLOVE) ×1 IMPLANT
GLOVE BIOGEL PI IND STRL 8 (GLOVE) ×1 IMPLANT
GOWN STRL REUS W/ TWL LRG LVL3 (GOWN DISPOSABLE) ×1 IMPLANT
GOWN STRL REUS W/TWL LRG LVL3 (GOWN DISPOSABLE) ×2
GOWN STRL REUS W/TWL XL LVL3 (GOWN DISPOSABLE) ×1 IMPLANT
GRAFT BNE CANC CHIPS 1-8 5CC (Bone Implant) IMPLANT
GUIDEWIRE ORTHO 0.054X6 (WIRE) IMPLANT
NDL HYPO 25X1 1.5 SAFETY (NEEDLE) IMPLANT
NEEDLE HYPO 25X1 1.5 SAFETY (NEEDLE) IMPLANT
NS IRRIG 1000ML POUR BTL (IV SOLUTION) ×1 IMPLANT
PACK BASIN DAY SURGERY FS (CUSTOM PROCEDURE TRAY) ×1 IMPLANT
PAD CAST 3X4 CTTN HI CHSV (CAST SUPPLIES) IMPLANT
PADDING CAST ABS COTTON 4X4 ST (CAST SUPPLIES) ×1 IMPLANT
PADDING CAST COTTON 3X4 STRL (CAST SUPPLIES)
PLATE PROXIMAL VDU ACULOC (Plate) IMPLANT
SCREW CORT FT 18X2.3XLCK HEX (Screw) IMPLANT
SCREW CORT FT 20X2.3XLCK HEX (Screw) IMPLANT
SCREW CORTICAL LOCKING 2.3X18M (Screw) ×3 IMPLANT
SCREW CORTICAL LOCKING 2.3X20M (Screw) ×3 IMPLANT
SCREW FX18X2.3XSMTH LCK NS CRT (Screw) IMPLANT
SCREW FX20X2.3XSMTH LCK NS CRT (Screw) IMPLANT
SCREW NLCKG 13 3.5X13 HEXA (Screw) IMPLANT
SCREW NON-LOCK 3.5X13 (Screw) ×1 IMPLANT
SCREW NONLOCK HEX 3.5X12 (Screw) IMPLANT
SPIKE FLUID TRANSFER (MISCELLANEOUS) ×1 IMPLANT
SPLINT PLASTER CAST XFAST 3X15 (CAST SUPPLIES) IMPLANT
STOCKINETTE 4X48 STRL (DRAPES) ×1 IMPLANT
STRIP CLOSURE SKIN 1/4X4 (GAUZE/BANDAGES/DRESSINGS) IMPLANT
SUT ETHILON 4 0 PS 2 18 (SUTURE) ×1 IMPLANT
SUT MNCRL AB 4-0 PS2 18 (SUTURE) IMPLANT
SUT VIC AB 3-0 FS2 27 (SUTURE) IMPLANT
SYR BULB EAR ULCER 3OZ GRN STR (SYRINGE) ×1 IMPLANT
SYR CONTROL 10ML LL (SYRINGE) IMPLANT
TOWEL GREEN STERILE FF (TOWEL DISPOSABLE) ×2 IMPLANT
UNDERPAD 30X36 HEAVY ABSORB (UNDERPADS AND DIAPERS) ×1 IMPLANT

## 2022-09-03 NOTE — Transfer of Care (Signed)
Immediate Anesthesia Transfer of Care Note  Patient: Jessica Rosales  Procedure(s) Performed: HARDWARE REMOVAL RIGHT WRIST (Right: Wrist) OPENING WEDGE OSTEOTOMY RIGHT DISTAL RADIUS WITH BONE GRAFT AND PLATING (Right: Wrist)  Patient Location: PACU  Anesthesia Type:MAC combined with regional for post-op pain  Level of Consciousness: drowsy, patient cooperative, and responds to stimulation  Airway & Oxygen Therapy: Patient Spontanous Breathing and Patient connected to face mask oxygen  Post-op Assessment: Report given to RN and Post -op Vital signs reviewed and stable  Post vital signs: Reviewed and stable  Last Vitals:  Vitals Value Taken Time  BP    Temp    Pulse 65 09/03/22 1225  Resp    SpO2 98 % 09/03/22 1225  Vitals shown include unvalidated device data.  Last Pain:  Vitals:   09/03/22 0801  TempSrc: Tympanic  PainSc: 5       Patients Stated Pain Goal: 8 (09/03/22 0801)  Complications: No notable events documented.

## 2022-09-03 NOTE — Op Note (Signed)
I assisted Surgeon(s) and Role:    * Betha Loa, MD - Primary    * Cindee Salt, MD - Assisting on the Procedure(s): HARDWARE REMOVAL RIGHT WRIST OPENING WEDGE OSTEOTOMY RIGHT DISTAL RADIUS WITH BONE GRAFT AND PLATING on 09/03/2022.  I provided assistance on this case as follows: setup, approach, verification of the radial artery with protection release of the brachial radialis removal of the old volar plate and screws, osteotomy of the distal radius followed by reduction and fixation of the distal radius with placement of allograft, closure of the wound and application of the dressing and splint.  Electronically signed by: Cindee Salt, MD Date: 09/03/2022 Time: 12:20 PM

## 2022-09-03 NOTE — Anesthesia Postprocedure Evaluation (Signed)
Anesthesia Post Note  Patient: Jessica Rosales  Procedure(s) Performed: HARDWARE REMOVAL RIGHT WRIST (Right: Wrist) OPENING WEDGE OSTEOTOMY RIGHT DISTAL RADIUS WITH BONE GRAFT AND PLATING (Right: Wrist)     Patient location during evaluation: PACU Anesthesia Type: Regional Level of consciousness: awake and alert Pain management: pain level controlled Vital Signs Assessment: post-procedure vital signs reviewed and stable Respiratory status: spontaneous breathing, nonlabored ventilation, respiratory function stable and patient connected to nasal cannula oxygen Cardiovascular status: stable and blood pressure returned to baseline Postop Assessment: no apparent nausea or vomiting Anesthetic complications: no  No notable events documented.  Last Vitals:  Vitals:   09/03/22 1235 09/03/22 1240  BP: (!) 122/58 (!) 122/58  Pulse: 66 65  Resp: 15 15  Temp:    SpO2: 94% 94%    Last Pain:  Vitals:   09/03/22 1240  TempSrc:   PainSc: 0-No pain                 Kennieth Rad

## 2022-09-03 NOTE — Discharge Instructions (Addendum)

## 2022-09-03 NOTE — Anesthesia Procedure Notes (Addendum)
Anesthesia Regional Block: Axillary brachial plexus block   Pre-Anesthetic Checklist: , timeout performed,  Correct Patient, Correct Site, Correct Laterality,  Correct Procedure, Correct Position, site marked,  Risks and benefits discussed,  Surgical consent,  Pre-op evaluation,  At surgeon's request and post-op pain management  Laterality: Right  Prep: chloraprep       Needles:  Injection technique: Single-shot  Needle Type: Echogenic Stimulator Needle     Needle Length: 9cm  Needle Gauge: 21     Additional Needles:   Procedures:, nerve stimulator,,, ultrasound used (permanent image in chart),,     Nerve Stimulator or Paresthesia:  Response: MC, median, ulnar and radial responses elicited, 0.5 mA  Additional Responses:   Narrative:  Start time: 09/03/2022 9:03 AM End time: 09/03/2022 9:10 AM Injection made incrementally with aspirations every 5 mL.  Performed by: Personally  Anesthesiologist: Marcene Duos, MD

## 2022-09-03 NOTE — Anesthesia Preprocedure Evaluation (Addendum)
Anesthesia Evaluation  Patient identified by MRN, date of birth, ID band Patient awake    Reviewed: Allergy & Precautions, NPO status , Patient's Chart, lab work & pertinent test results  Airway Mallampati: II  TM Distance: >3 FB Neck ROM: Full    Dental   Pulmonary neg pulmonary ROS   Pulmonary exam normal        Cardiovascular hypertension, Pt. on medications  Rhythm:Regular Rate:Normal     Neuro/Psych negative neurological ROS     GI/Hepatic Neg liver ROS,GERD  ,,  Endo/Other  negative endocrine ROS    Renal/GU negative Renal ROS     Musculoskeletal  (+) Arthritis ,    Abdominal   Peds  Hematology negative hematology ROS (+)   Anesthesia Other Findings   Reproductive/Obstetrics                             Anesthesia Physical Anesthesia Plan  ASA: 3  Anesthesia Plan: Regional and MAC   Post-op Pain Management: Regional block*, Celebrex PO (pre-op)* and Ofirmev IV (intra-op)*   Induction:   PONV Risk Score and Plan: 2 and Propofol infusion and Ondansetron  Airway Management Planned: Natural Airway and Simple Face Mask  Additional Equipment:   Intra-op Plan:   Post-operative Plan:   Informed Consent: I have reviewed the patients History and Physical, chart, labs and discussed the procedure including the risks, benefits and alternatives for the proposed anesthesia with the patient or authorized representative who has indicated his/her understanding and acceptance.       Plan Discussed with: CRNA  Anesthesia Plan Comments:         Anesthesia Quick Evaluation

## 2022-09-03 NOTE — Anesthesia Procedure Notes (Signed)
Procedure Name: MAC Date/Time: 09/03/2022 10:17 AM  Performed by: Thornell Mule, CRNAOxygen Delivery Method: Simple face mask

## 2022-09-03 NOTE — OR Nursing (Signed)
Plate and 10 screws removed.

## 2022-09-03 NOTE — Progress Notes (Signed)
Assisted Dr. Rob Fitzgerald with right, axillary, ultrasound guided block. Side rails up, monitors on throughout procedure. See vital signs in flow sheet. Tolerated Procedure well. ?

## 2022-09-03 NOTE — H&P (Signed)
Jessica Rosales is an 62 y.o. female.   Chief Complaint: distal radius malunion HPI: 62 yo female 3 months s/p orif right distal radius with increased volar angulation and prominent hardware.  She wishes to proceed with removal hardware, osteotomy distal radius and repeat fixation.   Allergies: No Known Allergies  Past Medical History:  Diagnosis Date   Arthritis    Depression    GERD (gastroesophageal reflux disease)    Hypertension     Past Surgical History:  Procedure Laterality Date   BUNIONECTOMY     HERNIA REPAIR     HYSTERECTOMY ABDOMINAL WITH SALPINGECTOMY     ORIF DISTAL RADIUS FRACTURE     Total Knee     WRIST FRACTURE SURGERY      Family History: Family History  Problem Relation Age of Onset   Breast cancer Neg Hx     Social History:   reports that she has never smoked. She has never used smokeless tobacco. She reports that she does not drink alcohol and does not use drugs.  Medications: Medications Prior to Admission  Medication Sig Dispense Refill   celecoxib (CELEBREX) 200 MG capsule Take 200 mg by mouth daily.     fluticasone (FLONASE) 50 MCG/ACT nasal spray Place 1 spray into both nostrils daily.     lisinopril (ZESTRIL) 30 MG tablet Take 30 mg by mouth daily.     metoprolol succinate (TOPROL-XL) 25 MG 24 hr tablet Take 25 mg by mouth daily.     omeprazole (PRILOSEC) 20 MG capsule Take 20 mg by mouth daily.     oxycodone (OXY-IR) 5 MG capsule Take 5 mg by mouth every 4 (four) hours as needed.     pramipexole (MIRAPEX) 1 MG tablet Take 1 mg by mouth 3 (three) times daily.     vortioxetine HBr (TRINTELLIX) 20 MG TABS tablet Take 20 mg by mouth daily.     cyclobenzaprine (FLEXERIL) 10 MG tablet Take 10 mg by mouth 3 (three) times daily as needed for muscle spasms.      No results found for this or any previous visit (from the past 48 hour(s)).  No results found.    Blood pressure (!) 128/55, pulse 65, temperature (!) 97.4 F (36.3 C), temperature  source Tympanic, resp. rate 18, height 5\' 8"  (1.727 m), weight (!) 136.8 kg, SpO2 96 %.  General appearance: alert, cooperative, and appears stated age Head: Normocephalic, without obvious abnormality, atraumatic Neck: supple, symmetrical, trachea midline Extremities: Intact sensation and capillary refill all digits.  +epl/fpl/io.  No wounds.  Pulses: 2+ and symmetric Skin: Skin color, texture, turgor normal. 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  Plan removal hardware, osteotomy and repeat fixation with bone graft.  Non operative and operative treatment options have been discussed with the patient and patient wishes to proceed with operative treatment. Risks, benefits, and alternatives of surgery have been discussed and the patient agrees with the plan of care.   Betha Loa 09/03/2022, 8:36 AM

## 2022-09-03 NOTE — Op Note (Signed)
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 female states she sustained a fracture of her right distal radius at the end of December 2023.  She underwent open reduction internal fixation at outside facility in early January.  She has had continued pain and displacement of fracture on radiographs.  She wishes to proceed with removal of hardware with osteotomy of the distal radius and repeat fixation.  We discussed nonoperative and operative treatment options.  She wished to proceed with operative fixation.  Risks, benefits, and alternatives of surgery were discussed including the risk of blood loss; infection; damage to nerves, vessels, tendons, ligaments, bone; failure of surgery; need for additional surgery; complications with wound healing; continued pain; nonunion; malunion; stiffness.  We also discussed the possible need for bone graft and the benefits and risks including the possibility of disease transmission.  She voiced understanding of these risks and elected to proceed.    OPERATIVE COURSE:  After being identified preoperatively by myself, the patient and I agreed upon the procedure and site of procedure.  Surgical site was marked.   Surgical consent had been signed.  She was given IV Ancef as preoperative antibiotic  prophylaxis.  She was transferred to the operating room and placed on the operating room table in supine position with the right upper extremity on an armboard. Sedation was induced by the anesthesiologist.  A regional block had been performed by anesthesia in preoperative holding.  The right upper extremity was prepped and draped in normal sterile orthopedic fashion.  A surgical pause was performed between the surgeons, anesthesia and operating room staff, and all were in agreement as to the patient, procedure and site of procedure.  Tourniquet at the proximal aspect of the extremity was inflated to 250 mmHg after exsanguination of the limb with an Esmarch bandage.  Previous incision line was followed.  The bipolar electrocautery was used to obtain hemostasis.  The superficial and deep portions of the FCR tendon sheath were incised, and the FCR and FPL were swept ulnarly to protect the palmar cutaneous branch of the median nerve.  There was scarring in the deep tissues.  The brachioradialis was still intact and was released at the radial side of the radius.  The pronator quadratus was released and elevated with the periosteal elevator.  The fracture had healed at the majority of the distal radius on the radial side.  The ulnar sided lunate facet fragment had not healed.  This was intra-articular.  The lunate was displaced volarly and had been articulating with the distal aspect of the plate.  There is a defect in the articular surface of the volar aspect of the lunate.  The previous surgical hardware was removed with the screwdriver.  The C-arm was used to visualize the distal radius and plan the osteotomy.  There was increased volar flexion of the distal radius.  The rongeurs were used to remove  scar and prominent bone volarly at the radius.  Osteotomy was made through the proximal screw holes from the previous plate.  The unhealed lunate facet fragment was mobilized and soft tissue interposition and scarring  removed.  The distal radius was able to be brought into better alignment using a lamina spreader.  The C-arm was used in AP and lateral projections to ensure appropriate positioning which was the case.  The distal radius was able to be reduced so that the ulna was no longer positive.  An [employer] volar distal radial locking plate was selected.  It was secured to the bone with the guidepins.  C-arm was used in AP and lateral projections to ensure appropriate position of the hardware.  Standard AO drilling and measuring technique was used.  The distal holes were filled first at the radial side.  Locking screws were placed in the radial styloid holes.  The ulnar lunate facet fragment was reduced and compressed and the final 2 holes on the ulnar side of the plate were filled with locking pegs.  This was adequate to stabilize the distal radius and the lunate facet fragment.  The screw holes in the shaft of the plate were then filled with standard AO drilling and measuring technique.  Good purchase was obtained.  The wound was copiously irrigated with sterile saline.  Cancellous bone graft was then packed into the osteotomy site and at the nonunion site.  C-arm was used in AP and lateral projections to ensure appropriate reduction position of heart which was the case.  There is good articular congruity and the radial length and inclination and appropriate volar tilt have been restored.  Pronator quadratus was repaired back over top of the plate using 4-0 Vicryl suture.  Vicryl suture was placed in the subcutaneous tissues in an inverted interrupted fashion and the skin was closed with 4-0 nylon in a horizontal mattress fashion.  There was good pronation and supination of the wrist without crepitance.  The wound was then dressed with sterile Xeroform, 4x4s, and wrapped with a Kerlix bandage.  A volar splint was placed and wrapped with Kerlix and Ace bandage.  Tourniquet was deflated at approximately 90 minutes.  Fingertips  were pink with brisk capillary refill after deflation of the tourniquet.  Operative drapes were broken down.  The patient was awoken from anesthesia safely.  She was transferred back to the stretcher and taken to the PACU in stable condition.  I will see her back in the office in one week for postoperative followup.  I will give her a prescription for Percocet 5/325 1-2 tabs PO q6 hours prn pain, dispense # 25.    Betha Loa, MD Electronically signed, 09/03/22

## 2022-09-04 ENCOUNTER — Encounter (HOSPITAL_BASED_OUTPATIENT_CLINIC_OR_DEPARTMENT_OTHER): Payer: Self-pay | Admitting: Orthopedic Surgery

## 2022-09-07 DIAGNOSIS — S52571P Other intraarticular fracture of lower end of right radius, subsequent encounter for closed fracture with malunion: Secondary | ICD-10-CM | POA: Diagnosis not present

## 2022-09-09 DIAGNOSIS — Z23 Encounter for immunization: Secondary | ICD-10-CM | POA: Diagnosis not present

## 2022-09-09 DIAGNOSIS — G2581 Restless legs syndrome: Secondary | ICD-10-CM | POA: Diagnosis not present

## 2022-09-09 DIAGNOSIS — I10 Essential (primary) hypertension: Secondary | ICD-10-CM | POA: Diagnosis not present

## 2022-09-09 DIAGNOSIS — K219 Gastro-esophageal reflux disease without esophagitis: Secondary | ICD-10-CM | POA: Diagnosis not present

## 2022-09-09 DIAGNOSIS — E7800 Pure hypercholesterolemia, unspecified: Secondary | ICD-10-CM | POA: Diagnosis not present

## 2022-09-09 DIAGNOSIS — Z79899 Other long term (current) drug therapy: Secondary | ICD-10-CM | POA: Diagnosis not present

## 2022-09-09 DIAGNOSIS — F330 Major depressive disorder, recurrent, mild: Secondary | ICD-10-CM | POA: Diagnosis not present

## 2022-09-09 DIAGNOSIS — R32 Unspecified urinary incontinence: Secondary | ICD-10-CM | POA: Diagnosis not present

## 2022-09-09 DIAGNOSIS — D509 Iron deficiency anemia, unspecified: Secondary | ICD-10-CM | POA: Diagnosis not present

## 2022-09-09 DIAGNOSIS — Z1159 Encounter for screening for other viral diseases: Secondary | ICD-10-CM | POA: Diagnosis not present

## 2022-09-17 DIAGNOSIS — Z4802 Encounter for removal of sutures: Secondary | ICD-10-CM | POA: Diagnosis not present

## 2022-09-17 DIAGNOSIS — S52601A Unspecified fracture of lower end of right ulna, initial encounter for closed fracture: Secondary | ICD-10-CM | POA: Diagnosis not present

## 2022-09-17 DIAGNOSIS — S52501A Unspecified fracture of the lower end of right radius, initial encounter for closed fracture: Secondary | ICD-10-CM | POA: Diagnosis not present

## 2022-09-17 DIAGNOSIS — S52571P Other intraarticular fracture of lower end of right radius, subsequent encounter for closed fracture with malunion: Secondary | ICD-10-CM | POA: Diagnosis not present

## 2022-09-18 ENCOUNTER — Encounter: Payer: Self-pay | Admitting: Gastroenterology

## 2022-09-30 DIAGNOSIS — S52601A Unspecified fracture of lower end of right ulna, initial encounter for closed fracture: Secondary | ICD-10-CM | POA: Diagnosis not present

## 2022-09-30 DIAGNOSIS — S52571P Other intraarticular fracture of lower end of right radius, subsequent encounter for closed fracture with malunion: Secondary | ICD-10-CM | POA: Diagnosis not present

## 2022-09-30 DIAGNOSIS — S52501A Unspecified fracture of the lower end of right radius, initial encounter for closed fracture: Secondary | ICD-10-CM | POA: Diagnosis not present

## 2022-10-13 DIAGNOSIS — S52501A Unspecified fracture of the lower end of right radius, initial encounter for closed fracture: Secondary | ICD-10-CM | POA: Diagnosis not present

## 2022-10-13 DIAGNOSIS — S52571P Other intraarticular fracture of lower end of right radius, subsequent encounter for closed fracture with malunion: Secondary | ICD-10-CM | POA: Diagnosis not present

## 2022-10-13 DIAGNOSIS — M25631 Stiffness of right wrist, not elsewhere classified: Secondary | ICD-10-CM | POA: Diagnosis not present

## 2022-10-13 DIAGNOSIS — Z4889 Encounter for other specified surgical aftercare: Secondary | ICD-10-CM | POA: Diagnosis not present

## 2022-10-13 DIAGNOSIS — S52601A Unspecified fracture of lower end of right ulna, initial encounter for closed fracture: Secondary | ICD-10-CM | POA: Diagnosis not present

## 2022-10-26 ENCOUNTER — Ambulatory Visit (AMBULATORY_SURGERY_CENTER): Payer: PPO

## 2022-10-26 VITALS — Ht 68.0 in | Wt 300.0 lb

## 2022-10-26 DIAGNOSIS — Z1211 Encounter for screening for malignant neoplasm of colon: Secondary | ICD-10-CM

## 2022-10-26 MED ORDER — NA SULFATE-K SULFATE-MG SULF 17.5-3.13-1.6 GM/177ML PO SOLN: 1.0000 | Freq: Once | ORAL | 0 refills | Status: DC

## 2022-10-26 NOTE — Progress Notes (Signed)
No egg or soy allergy known to patient  No issues known to pt with past sedation with any surgeries or procedures Patient denies ever being told they had issues or difficulty with intubation  No FH of Malignant Hyperthermia Pt is not on diet pills Pt is not on  home 02  Pt is not on blood thinners  Pt denies issues with constipation  No A fib or A flutter Have any cardiac testing pending--no Pt instructed to use Single[employer]com or GoodRx for a price reduction on prep  Patient's chart reviewed by Cathlyn Parsons CNRA prior to previsit and patient appropriate for the [facility].  Previsit completed and red dot placed by patient's name on their procedure day (on provider's schedule).   Can ambulate with cane

## 2022-11-04 DIAGNOSIS — S52601A Unspecified fracture of lower end of right ulna, initial encounter for closed fracture: Secondary | ICD-10-CM | POA: Diagnosis not present

## 2022-11-04 DIAGNOSIS — Z4889 Encounter for other specified surgical aftercare: Secondary | ICD-10-CM | POA: Diagnosis not present

## 2022-11-04 DIAGNOSIS — S52501A Unspecified fracture of the lower end of right radius, initial encounter for closed fracture: Secondary | ICD-10-CM | POA: Diagnosis not present

## 2022-11-04 DIAGNOSIS — M25631 Stiffness of right wrist, not elsewhere classified: Secondary | ICD-10-CM | POA: Diagnosis not present

## 2022-11-04 DIAGNOSIS — S52571P Other intraarticular fracture of lower end of right radius, subsequent encounter for closed fracture with malunion: Secondary | ICD-10-CM | POA: Diagnosis not present

## 2022-11-06 ENCOUNTER — Encounter: Payer: Self-pay | Admitting: Gastroenterology

## 2022-11-23 ENCOUNTER — Other Ambulatory Visit: Payer: Self-pay

## 2022-11-23 ENCOUNTER — Telehealth: Payer: Self-pay | Admitting: Gastroenterology

## 2022-11-23 DIAGNOSIS — Z1211 Encounter for screening for malignant neoplasm of colon: Secondary | ICD-10-CM

## 2022-11-23 MED ORDER — NA SULFATE-K SULFATE-MG SULF 17.5-3.13-1.6 GM/177ML PO SOLN
1.0000 | Freq: Once | ORAL | 0 refills | Status: AC
Start: 2022-11-23 — End: 2022-11-23

## 2022-11-23 NOTE — Telephone Encounter (Signed)
New rx sent to preferred pharm. Patient made aware.

## 2022-11-23 NOTE — Telephone Encounter (Signed)
PT needs to  pickup Suprep but the pharmacy would not accept the script and today is when she starts the prep. It should be sent to [facility] in [place]

## 2022-11-24 ENCOUNTER — Ambulatory Visit (AMBULATORY_SURGERY_CENTER): Payer: PPO | Admitting: Gastroenterology

## 2022-11-24 ENCOUNTER — Encounter: Payer: Self-pay | Admitting: Gastroenterology

## 2022-11-24 VITALS — BP 115/57 | HR 76 | Temp 98.0°F | Resp 21 | Ht 68.0 in | Wt 300.0 lb

## 2022-11-24 DIAGNOSIS — Z1211 Encounter for screening for malignant neoplasm of colon: Secondary | ICD-10-CM

## 2022-11-24 DIAGNOSIS — F419 Anxiety disorder, unspecified: Secondary | ICD-10-CM | POA: Diagnosis not present

## 2022-11-24 DIAGNOSIS — F32A Depression, unspecified: Secondary | ICD-10-CM | POA: Diagnosis not present

## 2022-11-24 DIAGNOSIS — I10 Essential (primary) hypertension: Secondary | ICD-10-CM | POA: Diagnosis not present

## 2022-11-24 DIAGNOSIS — D120 Benign neoplasm of cecum: Secondary | ICD-10-CM | POA: Diagnosis not present

## 2022-11-24 MED ORDER — SODIUM CHLORIDE 0.9 % IV SOLN
500.0000 mL | Freq: Once | INTRAVENOUS | Status: DC
Start: 2022-11-24 — End: 2022-11-24

## 2022-11-24 NOTE — Progress Notes (Signed)
[facility] History and Physical   Primary Care Physician:  Alinda Deem, MD   Reason for Procedure:   CRC screening  Plan:    colon     HPI: Jessica Rosales is a 62 y.o. female    Past Medical History:  Diagnosis Date   Allergy    Anxiety    Arthritis    Depression    GERD (gastroesophageal reflux disease)    Hypertension     Past Surgical History:  Procedure Laterality Date   BUNIONECTOMY     HARDWARE REMOVAL Right 09/03/2022   Procedure: HARDWARE REMOVAL RIGHT WRIST;  Surgeon: Betha Loa, MD;  Location: [facility];  Service: Orthopedics;  Laterality: Right;  120 MIN   HERNIA REPAIR     HYSTERECTOMY ABDOMINAL WITH SALPINGECTOMY     ORIF DISTAL RADIUS FRACTURE     Total Knee     WRIST FRACTURE SURGERY     WRIST OSTEOTOMY Right 09/03/2022   Procedure: OPENING WEDGE OSTEOTOMY RIGHT DISTAL RADIUS WITH BONE GRAFT AND PLATING;  Surgeon: Betha Loa, MD;  Location: [facility];  Service: Orthopedics;  Laterality: Right;    Prior to Admission medications   Medication Sig Start Date End Date Taking? Authorizing Provider  celecoxib (CELEBREX) 200 MG capsule Take 200 mg by mouth daily.   Yes [provider]  celecoxib (CELEBREX) 200 MG capsule  02/22/20  Yes [provider]  fluticasone (FLONASE) 50 MCG/ACT nasal spray Place 1 spray into both nostrils daily.   Yes [provider]  hydrochlorothiazide (HYDRODIURIL) 25 MG tablet Take 25 mg by mouth daily.   Yes [provider]  lisinopril (ZESTRIL) 30 MG tablet Take 30 mg by mouth daily.   Yes [provider]  Magnesium Oxide (MAG-200 PO) Take by mouth.   Yes [provider]  metoprolol succinate (TOPROL-XL) 25 MG 24 hr tablet Take 25 mg by mouth daily.   Yes [provider]  omeprazole (PRILOSEC) 20 MG capsule Take 20 mg by mouth daily.   Yes [provider]  oxybutynin (DITROPAN-XL) 5 MG 24 hr tablet Take 1  tablet by mouth daily. 09/09/22  Yes [provider]  Potassium 99 MG TABS Take by mouth.   Yes [provider]  pramipexole (MIRAPEX) 1 MG tablet Take 1 mg by mouth 3 (three) times daily.   Yes [provider]  vortioxetine HBr (TRINTELLIX) 20 MG TABS tablet Take 20 mg by mouth daily.   Yes [provider]  cyclobenzaprine (FLEXERIL) 10 MG tablet Take 10 mg by mouth 3 (three) times daily as needed for muscle spasms.    [provider]  oxycodone (OXY-IR) 5 MG capsule Take 5 mg by mouth every 4 (four) hours as needed. Patient not taking: Reported on 10/26/2022    [provider]  oxyCODONE-acetaminophen (PERCOCET) 5-325 MG tablet 1-2 tabs PO q6 hours prn pain Patient not taking: Reported on 10/26/2022 09/03/22   Betha Loa, MD    Current Outpatient Medications  Medication Sig Dispense Refill   celecoxib (CELEBREX) 200 MG capsule Take 200 mg by mouth daily.     celecoxib (CELEBREX) 200 MG capsule      fluticasone (FLONASE) 50 MCG/ACT nasal spray Place 1 spray into both nostrils daily.     hydrochlorothiazide (HYDRODIURIL) 25 MG tablet Take 25 mg by mouth daily.     lisinopril (ZESTRIL) 30 MG tablet Take 30 mg by mouth daily.     Magnesium Oxide (MAG-200  PO) Take by mouth.     metoprolol succinate (TOPROL-XL) 25 MG 24 hr tablet Take 25 mg by mouth daily.     omeprazole (PRILOSEC) 20 MG capsule Take 20 mg by mouth daily.     oxybutynin (DITROPAN-XL) 5 MG 24 hr tablet Take 1 tablet by mouth daily.     Potassium 99 MG TABS Take by mouth.     pramipexole (MIRAPEX) 1 MG tablet Take 1 mg by mouth 3 (three) times daily.     vortioxetine HBr (TRINTELLIX) 20 MG TABS tablet Take 20 mg by mouth daily.     cyclobenzaprine (FLEXERIL) 10 MG tablet Take 10 mg by mouth 3 (three) times daily as needed for muscle spasms.     oxycodone (OXY-IR) 5 MG capsule Take 5 mg by mouth every 4 (four) hours as needed. (Patient not taking: Reported on 10/26/2022)      oxyCODONE-acetaminophen (PERCOCET) 5-325 MG tablet 1-2 tabs PO q6 hours prn pain (Patient not taking: Reported on 10/26/2022) 25 tablet 0   Current Facility-Administered Medications  Medication Dose Route Frequency Provider Last Rate Last Admin   0.9 %  sodium chloride infusion  500 mL Intravenous Once Lynann Bologna, MD        Allergies as of 11/24/2022   (No Known Allergies)    Family History  Problem Relation Age of Onset   Breast cancer Neg Hx    Colon polyps Neg Hx    Crohn's disease Neg Hx    Esophageal cancer Neg Hx    Rectal cancer Neg Hx    Stomach cancer Neg Hx     Social History   Socioeconomic History   Marital status: Married    Spouse name: Not on file   Number of children: Not on file   Years of education: Not on file   Highest education level: Not on file  Occupational History   Not on file  Tobacco Use   Smoking status: Never   Smokeless tobacco: Never  Vaping Use   Vaping Use: Never used  Substance and Sexual Activity   Alcohol use: Never   Drug use: Never   Sexual activity: Not on file  Other Topics Concern   Not on file  Social History Narrative   Not on file   Social Determinants of Health   Financial Resource Strain: Not on file  Food Insecurity: Not on file  Transportation Needs: Not on file  Physical Activity: Not on file  Stress: Not on file  Social Connections: Not on file  Intimate Partner Violence: Not on file    Review of Systems: Positive for none All other review of systems negative except as mentioned in the HPI.  Physical Exam: Vital signs in last 24 hours: @VSRANGES @   General:   Alert,  Well-developed, well-nourished, pleasant and cooperative in NAD Lungs:  Clear throughout to auscultation.   Heart:  Regular rate and rhythm; no murmurs, clicks, rubs,  or gallops. Abdomen:  Soft, nontender and nondistended. Normal bowel sounds.   Neuro/Psych:  Alert and cooperative. Normal mood and affect. A and O x 3    No  significant changes were identified.  The patient continues to be an appropriate candidate for the planned procedure and anesthesia.   Edman Circle, MD. [facility] 11/24/2022 8:03 AM@

## 2022-11-24 NOTE — Op Note (Signed)
[facility] Patient Name: Jessica Rosales Procedure Date: 11/24/2022 8:02 AM MRN: [id number] Endoscopist: Lynann Bologna , MD, [id number] Age: 62 Referring MD:  Date of Birth: [birth year] Gender: Female Account #: [id number] Procedure:                Colonoscopy Indications:              Screening for colorectal malignant neoplasm Medicines:                Monitored Anesthesia Care Procedure:                Pre-Anesthesia Assessment:                           - Prior to the procedure, a History and Physical                            was performed, and patient medications and                            allergies were reviewed. The patient's tolerance of                            previous anesthesia was also reviewed. The risks                            and benefits of the procedure and the sedation                            options and risks were discussed with the patient.                            All questions were answered, and informed consent                            was obtained. Prior Anticoagulants: The patient has                            taken no anticoagulant or antiplatelet agents. ASA                            Grade Assessment: II - A patient with mild systemic                            disease. After reviewing the risks and benefits,                            the patient was deemed in satisfactory condition to                            undergo the procedure.                           After obtaining informed consent, the colonoscope  was passed under direct vision. Throughout the                            procedure, the patient's blood pressure, pulse, and                            oxygen saturations were monitored continuously. The                            Olympus PCF Scope [id number] was introduced through                            the anus and advanced to the the cecum, identified                            by  appendiceal orifice and ileocecal valve. The                            colonoscopy was performed without difficulty. The                            patient tolerated the procedure well. The quality                            of the bowel preparation was good. The ileocecal                            valve, appendiceal orifice, and rectum were                            photographed. Scope In: 8:08:32 AM Scope Out: 8:20:33 AM Scope Withdrawal Time: 0 hours 9 minutes 22 seconds  Total Procedure Duration: 0 hours 12 minutes 1 second  Findings:                 A 6 mm polyp was found in the cecum. The polyp was                            sessile. The polyp was removed with a cold snare.                            Resection and retrieval were complete.                           There was a medium-sized lipoma, 10 mm in diameter,                            in the mid ascending colon.                           A few medium-mouthed diverticula were found in the                            sigmoid colon.  Non-bleeding internal hemorrhoids were found during                            retroflexion. The hemorrhoids were small and Grade                            I (internal hemorrhoids that do not prolapse).                           The exam was otherwise without abnormality on                            direct and retroflexion views. Complications:            No immediate complications. Estimated Blood Loss:     Estimated blood loss: none. Impression:               - One 6 mm polyp in the cecum, removed with a cold                            snare. Resected and retrieved.                           - Medium-sized lipoma in the mid ascending colon.                           - Mild sigmoid diverticulosis                           - Non-bleeding internal hemorrhoids.                           - The examination was otherwise normal on direct                            and  retroflexion views. Recommendation:           - Patient has a contact number available for                            emergencies. The signs and symptoms of potential                            delayed complications were discussed with the                            patient. Return to normal activities tomorrow.                            Written discharge instructions were provided to the                            patient.                           - High fiber diet.                           -  Continue present medications.                           - Await pathology results.                           - Repeat colonoscopy for surveillance based on                            pathology results.                           - The findings and recommendations were discussed                            with the patient's family. Lynann Bologna, MD 11/24/2022 8:25:19 AM This report has been signed electronically.

## 2022-11-24 NOTE — Progress Notes (Signed)
Called to room to assist during endoscopic procedure.  Patient ID and intended procedure confirmed with present staff. Received instructions for my participation in the procedure from the performing physician.  

## 2022-11-24 NOTE — Progress Notes (Signed)
Uneventful anesthetic. Report to pacu rn. Vss. Care resumed by rn. 

## 2022-11-24 NOTE — Progress Notes (Signed)
Pt's states no medical or surgical changes since previsit or office visit. 

## 2022-11-24 NOTE — Patient Instructions (Signed)
Thank you for letting us take care of your healthcare needs today. Please see handouts given to you on Polyps, Diverticulosis and Hemorrhoids.     YOU HAD AN ENDOSCOPIC PROCEDURE TODAY AT THE [facility]:   Refer to the procedure report that was given to you for any specific questions about what was found during the examination.  If the procedure report does not answer your questions, please call your gastroenterologist to clarify.  If you requested that your care partner not be given the details of your procedure findings, then the procedure report has been included in a sealed envelope for you to review at your convenience later.  YOU SHOULD EXPECT: Some feelings of bloating in the abdomen. Passage of more gas than usual.  Walking can help get rid of the air that was put into your GI tract during the procedure and reduce the bloating. If you had a lower endoscopy (such as a colonoscopy or flexible sigmoidoscopy) you may notice spotting of blood in your stool or on the toilet paper. If you underwent a bowel prep for your procedure, you may not have a normal bowel movement for a few days.  Please Note:  You might notice some irritation and congestion in your nose or some drainage.  This is from the oxygen used during your procedure.  There is no need for concern and it should clear up in a day or so.  SYMPTOMS TO REPORT IMMEDIATELY:  Following lower endoscopy (colonoscopy or flexible sigmoidoscopy):  Excessive amounts of blood in the stool  Significant tenderness or worsening of abdominal pains  Swelling of the abdomen that is new, acute  Fever of 100F or higher   For urgent or emergent issues, a gastroenterologist can be reached at any hour by calling [phone]. Do not use MyChart messaging for urgent concerns.    DIET:  We do recommend a small meal at first, but then you may proceed to your regular diet.  Drink plenty of fluids but you should avoid alcoholic beverages  for 24 hours.  ACTIVITY:  You should plan to take it easy for the rest of today and you should NOT DRIVE or use heavy machinery until tomorrow (because of the sedation medicines used during the test).    FOLLOW UP: Our staff will call the number listed on your records the next business day following your procedure.  We will call around 7:15- 8:00 am to check on you and address any questions or concerns that you may have regarding the information given to you following your procedure. If we do not reach you, we will leave a message.     If any biopsies were taken you will be contacted by phone or by letter within the next 1-3 weeks.  Please call us at [phone] if you have not heard about the biopsies in 3 weeks.    SIGNATURES/CONFIDENTIALITY: You and/or your care partner have signed paperwork which will be entered into your electronic medical record.  These signatures attest to the fact that that the information above on your After Visit Summary has been reviewed and is understood.  Full responsibility of the confidentiality of this discharge information lies with you and/or your care-partner. 

## 2022-11-25 ENCOUNTER — Telehealth: Payer: Self-pay

## 2022-11-25 DIAGNOSIS — S52571P Other intraarticular fracture of lower end of right radius, subsequent encounter for closed fracture with malunion: Secondary | ICD-10-CM | POA: Diagnosis not present

## 2022-11-25 NOTE — Telephone Encounter (Signed)
Post procedure follow up call, no answer 

## 2022-12-04 ENCOUNTER — Encounter: Payer: Self-pay | Admitting: Gastroenterology

## 2022-12-09 DIAGNOSIS — Z6841 Body Mass Index (BMI) 40.0 and over, adult: Secondary | ICD-10-CM | POA: Diagnosis not present

## 2022-12-09 DIAGNOSIS — I10 Essential (primary) hypertension: Secondary | ICD-10-CM | POA: Diagnosis not present

## 2022-12-09 DIAGNOSIS — G2581 Restless legs syndrome: Secondary | ICD-10-CM | POA: Diagnosis not present

## 2022-12-09 DIAGNOSIS — F330 Major depressive disorder, recurrent, mild: Secondary | ICD-10-CM | POA: Diagnosis not present

## 2022-12-09 DIAGNOSIS — K219 Gastro-esophageal reflux disease without esophagitis: Secondary | ICD-10-CM | POA: Diagnosis not present

## 2022-12-09 DIAGNOSIS — E7800 Pure hypercholesterolemia, unspecified: Secondary | ICD-10-CM | POA: Diagnosis not present

## 2022-12-21 DIAGNOSIS — M1711 Unilateral primary osteoarthritis, right knee: Secondary | ICD-10-CM | POA: Diagnosis not present

## 2022-12-21 DIAGNOSIS — M25461 Effusion, right knee: Secondary | ICD-10-CM | POA: Diagnosis not present

## 2023-01-15 DIAGNOSIS — M1711 Unilateral primary osteoarthritis, right knee: Secondary | ICD-10-CM | POA: Diagnosis not present

## 2023-01-18 DIAGNOSIS — L03119 Cellulitis of unspecified part of limb: Secondary | ICD-10-CM | POA: Diagnosis not present

## 2023-01-21 DIAGNOSIS — L03119 Cellulitis of unspecified part of limb: Secondary | ICD-10-CM | POA: Diagnosis not present

## 2023-01-26 DIAGNOSIS — L03119 Cellulitis of unspecified part of limb: Secondary | ICD-10-CM | POA: Diagnosis not present

## 2023-02-03 DIAGNOSIS — Z79899 Other long term (current) drug therapy: Secondary | ICD-10-CM | POA: Diagnosis not present

## 2023-02-05 DIAGNOSIS — J069 Acute upper respiratory infection, unspecified: Secondary | ICD-10-CM | POA: Diagnosis not present

## 2023-02-05 DIAGNOSIS — R051 Acute cough: Secondary | ICD-10-CM | POA: Diagnosis not present

## 2023-02-05 DIAGNOSIS — U071 COVID-19: Secondary | ICD-10-CM | POA: Diagnosis not present

## 2023-02-19 DIAGNOSIS — M1711 Unilateral primary osteoarthritis, right knee: Secondary | ICD-10-CM | POA: Diagnosis not present

## 2023-03-02 DIAGNOSIS — I10 Essential (primary) hypertension: Secondary | ICD-10-CM | POA: Diagnosis not present

## 2023-03-02 DIAGNOSIS — G8918 Other acute postprocedural pain: Secondary | ICD-10-CM | POA: Diagnosis not present

## 2023-03-02 DIAGNOSIS — M1711 Unilateral primary osteoarthritis, right knee: Secondary | ICD-10-CM | POA: Diagnosis not present

## 2023-03-02 DIAGNOSIS — K219 Gastro-esophageal reflux disease without esophagitis: Secondary | ICD-10-CM | POA: Diagnosis not present

## 2023-03-02 DIAGNOSIS — Z6841 Body Mass Index (BMI) 40.0 and over, adult: Secondary | ICD-10-CM | POA: Diagnosis not present

## 2023-03-08 DIAGNOSIS — I10 Essential (primary) hypertension: Secondary | ICD-10-CM | POA: Diagnosis not present

## 2023-03-08 DIAGNOSIS — Z7982 Long term (current) use of aspirin: Secondary | ICD-10-CM | POA: Diagnosis not present

## 2023-03-08 DIAGNOSIS — Z471 Aftercare following joint replacement surgery: Secondary | ICD-10-CM | POA: Diagnosis not present

## 2023-03-08 DIAGNOSIS — F419 Anxiety disorder, unspecified: Secondary | ICD-10-CM | POA: Diagnosis not present

## 2023-03-08 DIAGNOSIS — Z791 Long term (current) use of non-steroidal anti-inflammatories (NSAID): Secondary | ICD-10-CM | POA: Diagnosis not present

## 2023-03-08 DIAGNOSIS — Z6841 Body Mass Index (BMI) 40.0 and over, adult: Secondary | ICD-10-CM | POA: Diagnosis not present

## 2023-03-08 DIAGNOSIS — K589 Irritable bowel syndrome without diarrhea: Secondary | ICD-10-CM | POA: Diagnosis not present

## 2023-03-08 DIAGNOSIS — Z87891 Personal history of nicotine dependence: Secondary | ICD-10-CM | POA: Diagnosis not present

## 2023-03-08 DIAGNOSIS — K760 Fatty (change of) liver, not elsewhere classified: Secondary | ICD-10-CM | POA: Diagnosis not present

## 2023-03-08 DIAGNOSIS — E785 Hyperlipidemia, unspecified: Secondary | ICD-10-CM | POA: Diagnosis not present

## 2023-03-08 DIAGNOSIS — K219 Gastro-esophageal reflux disease without esophagitis: Secondary | ICD-10-CM | POA: Diagnosis not present

## 2023-03-08 DIAGNOSIS — F32A Depression, unspecified: Secondary | ICD-10-CM | POA: Diagnosis not present

## 2023-03-08 DIAGNOSIS — G2581 Restless legs syndrome: Secondary | ICD-10-CM | POA: Diagnosis not present

## 2023-03-08 DIAGNOSIS — Z96653 Presence of artificial knee joint, bilateral: Secondary | ICD-10-CM | POA: Diagnosis not present

## 2023-03-16 DIAGNOSIS — Z96651 Presence of right artificial knee joint: Secondary | ICD-10-CM | POA: Diagnosis not present

## 2023-03-16 DIAGNOSIS — Z471 Aftercare following joint replacement surgery: Secondary | ICD-10-CM | POA: Diagnosis not present

## 2023-03-22 DIAGNOSIS — M25561 Pain in right knee: Secondary | ICD-10-CM | POA: Diagnosis not present

## 2023-03-22 DIAGNOSIS — Z96651 Presence of right artificial knee joint: Secondary | ICD-10-CM | POA: Diagnosis not present

## 2023-03-22 DIAGNOSIS — R2689 Other abnormalities of gait and mobility: Secondary | ICD-10-CM | POA: Diagnosis not present

## 2023-03-23 DIAGNOSIS — M25561 Pain in right knee: Secondary | ICD-10-CM | POA: Diagnosis not present

## 2023-03-23 DIAGNOSIS — Z96651 Presence of right artificial knee joint: Secondary | ICD-10-CM | POA: Diagnosis not present

## 2023-03-23 DIAGNOSIS — R2689 Other abnormalities of gait and mobility: Secondary | ICD-10-CM | POA: Diagnosis not present

## 2023-03-24 DIAGNOSIS — F329 Major depressive disorder, single episode, unspecified: Secondary | ICD-10-CM | POA: Diagnosis not present

## 2023-03-24 DIAGNOSIS — I10 Essential (primary) hypertension: Secondary | ICD-10-CM | POA: Diagnosis not present

## 2023-03-24 DIAGNOSIS — E7800 Pure hypercholesterolemia, unspecified: Secondary | ICD-10-CM | POA: Diagnosis not present

## 2023-03-24 DIAGNOSIS — Z6841 Body Mass Index (BMI) 40.0 and over, adult: Secondary | ICD-10-CM | POA: Diagnosis not present

## 2023-03-24 DIAGNOSIS — R32 Unspecified urinary incontinence: Secondary | ICD-10-CM | POA: Diagnosis not present

## 2023-03-24 DIAGNOSIS — G2581 Restless legs syndrome: Secondary | ICD-10-CM | POA: Diagnosis not present

## 2023-03-29 DIAGNOSIS — R2689 Other abnormalities of gait and mobility: Secondary | ICD-10-CM | POA: Diagnosis not present

## 2023-03-29 DIAGNOSIS — Z96651 Presence of right artificial knee joint: Secondary | ICD-10-CM | POA: Diagnosis not present

## 2023-03-29 DIAGNOSIS — M25561 Pain in right knee: Secondary | ICD-10-CM | POA: Diagnosis not present

## 2023-04-01 DIAGNOSIS — M25561 Pain in right knee: Secondary | ICD-10-CM | POA: Diagnosis not present

## 2023-04-01 DIAGNOSIS — R2689 Other abnormalities of gait and mobility: Secondary | ICD-10-CM | POA: Diagnosis not present

## 2023-04-01 DIAGNOSIS — Z96651 Presence of right artificial knee joint: Secondary | ICD-10-CM | POA: Diagnosis not present

## 2023-04-05 DIAGNOSIS — M25561 Pain in right knee: Secondary | ICD-10-CM | POA: Diagnosis not present

## 2023-04-05 DIAGNOSIS — R2689 Other abnormalities of gait and mobility: Secondary | ICD-10-CM | POA: Diagnosis not present

## 2023-04-05 DIAGNOSIS — Z96651 Presence of right artificial knee joint: Secondary | ICD-10-CM | POA: Diagnosis not present

## 2023-04-08 DIAGNOSIS — R2689 Other abnormalities of gait and mobility: Secondary | ICD-10-CM | POA: Diagnosis not present

## 2023-04-08 DIAGNOSIS — Z96651 Presence of right artificial knee joint: Secondary | ICD-10-CM | POA: Diagnosis not present

## 2023-04-08 DIAGNOSIS — M25561 Pain in right knee: Secondary | ICD-10-CM | POA: Diagnosis not present

## 2023-04-12 DIAGNOSIS — Z96651 Presence of right artificial knee joint: Secondary | ICD-10-CM | POA: Diagnosis not present

## 2023-04-12 DIAGNOSIS — M25561 Pain in right knee: Secondary | ICD-10-CM | POA: Diagnosis not present

## 2023-04-12 DIAGNOSIS — R2689 Other abnormalities of gait and mobility: Secondary | ICD-10-CM | POA: Diagnosis not present

## 2023-04-19 DIAGNOSIS — M25561 Pain in right knee: Secondary | ICD-10-CM | POA: Diagnosis not present

## 2023-04-19 DIAGNOSIS — Z96651 Presence of right artificial knee joint: Secondary | ICD-10-CM | POA: Diagnosis not present

## 2023-04-19 DIAGNOSIS — R2689 Other abnormalities of gait and mobility: Secondary | ICD-10-CM | POA: Diagnosis not present

## 2023-05-27 DIAGNOSIS — M549 Dorsalgia, unspecified: Secondary | ICD-10-CM | POA: Diagnosis not present

## 2023-05-27 DIAGNOSIS — M47816 Spondylosis without myelopathy or radiculopathy, lumbar region: Secondary | ICD-10-CM | POA: Diagnosis not present

## 2023-06-07 DIAGNOSIS — R051 Acute cough: Secondary | ICD-10-CM | POA: Diagnosis not present

## 2023-06-07 DIAGNOSIS — J0100 Acute maxillary sinusitis, unspecified: Secondary | ICD-10-CM | POA: Diagnosis not present

## 2023-06-15 DIAGNOSIS — M47816 Spondylosis without myelopathy or radiculopathy, lumbar region: Secondary | ICD-10-CM | POA: Diagnosis not present

## 2023-07-19 DIAGNOSIS — L03119 Cellulitis of unspecified part of limb: Secondary | ICD-10-CM | POA: Diagnosis not present

## 2023-07-23 DIAGNOSIS — R32 Unspecified urinary incontinence: Secondary | ICD-10-CM | POA: Diagnosis not present

## 2023-07-23 DIAGNOSIS — I10 Essential (primary) hypertension: Secondary | ICD-10-CM | POA: Diagnosis not present

## 2023-07-23 DIAGNOSIS — K219 Gastro-esophageal reflux disease without esophagitis: Secondary | ICD-10-CM | POA: Diagnosis not present

## 2023-07-23 DIAGNOSIS — G2581 Restless legs syndrome: Secondary | ICD-10-CM | POA: Diagnosis not present

## 2023-07-23 DIAGNOSIS — L0390 Cellulitis, unspecified: Secondary | ICD-10-CM | POA: Diagnosis not present

## 2023-07-23 DIAGNOSIS — F329 Major depressive disorder, single episode, unspecified: Secondary | ICD-10-CM | POA: Diagnosis not present

## 2023-07-23 DIAGNOSIS — E7800 Pure hypercholesterolemia, unspecified: Secondary | ICD-10-CM | POA: Diagnosis not present

## 2023-07-24 LAB — LAB REPORT - SCANNED: EGFR: 87

## 2023-08-06 DIAGNOSIS — J0100 Acute maxillary sinusitis, unspecified: Secondary | ICD-10-CM | POA: Diagnosis not present

## 2023-08-11 DIAGNOSIS — J069 Acute upper respiratory infection, unspecified: Secondary | ICD-10-CM | POA: Diagnosis not present

## 2023-08-11 DIAGNOSIS — R509 Fever, unspecified: Secondary | ICD-10-CM | POA: Diagnosis not present

## 2023-09-23 ENCOUNTER — Ambulatory Visit: Admitting: Gastroenterology

## 2023-09-23 ENCOUNTER — Other Ambulatory Visit (INDEPENDENT_AMBULATORY_CARE_PROVIDER_SITE_OTHER)

## 2023-09-23 ENCOUNTER — Encounter: Payer: Self-pay | Admitting: Gastroenterology

## 2023-09-23 VITALS — BP 126/72 | HR 69 | Ht 68.0 in | Wt 318.0 lb

## 2023-09-23 DIAGNOSIS — K219 Gastro-esophageal reflux disease without esophagitis: Secondary | ICD-10-CM

## 2023-09-23 DIAGNOSIS — R160 Hepatomegaly, not elsewhere classified: Secondary | ICD-10-CM | POA: Diagnosis not present

## 2023-09-23 DIAGNOSIS — D509 Iron deficiency anemia, unspecified: Secondary | ICD-10-CM | POA: Diagnosis not present

## 2023-09-23 DIAGNOSIS — R1310 Dysphagia, unspecified: Secondary | ICD-10-CM | POA: Diagnosis not present

## 2023-09-23 DIAGNOSIS — R748 Abnormal levels of other serum enzymes: Secondary | ICD-10-CM | POA: Diagnosis not present

## 2023-09-23 LAB — CBC WITH DIFFERENTIAL/PLATELET
Basophils Absolute: 0.1 10*3/uL (ref 0.0–0.1)
Basophils Relative: 0.9 % (ref 0.0–3.0)
Eosinophils Absolute: 0.1 10*3/uL (ref 0.0–0.7)
Eosinophils Relative: 1.7 % (ref 0.0–5.0)
HCT: 31.8 % — ABNORMAL LOW (ref 36.0–46.0)
Hemoglobin: 10.2 g/dL — ABNORMAL LOW (ref 12.0–15.0)
Lymphocytes Relative: 21.9 % (ref 12.0–46.0)
Lymphs Abs: 1.8 10*3/uL (ref 0.7–4.0)
MCHC: 32 g/dL (ref 30.0–36.0)
MCV: 85.5 fl (ref 78.0–100.0)
Monocytes Absolute: 0.6 10*3/uL (ref 0.1–1.0)
Monocytes Relative: 7.6 % (ref 3.0–12.0)
Neutro Abs: 5.6 10*3/uL (ref 1.4–7.7)
Neutrophils Relative %: 67.9 % (ref 43.0–77.0)
Platelets: 365 10*3/uL (ref 150.0–400.0)
RBC: 3.72 Mil/uL — ABNORMAL LOW (ref 3.87–5.11)
RDW: 16.9 % — ABNORMAL HIGH (ref 11.5–15.5)
WBC: 8.3 10*3/uL (ref 4.0–10.5)

## 2023-09-23 LAB — COMPREHENSIVE METABOLIC PANEL WITH GFR
ALT: 17 U/L (ref 0–35)
AST: 21 U/L (ref 0–37)
Albumin: 3.8 g/dL (ref 3.5–5.2)
Alkaline Phosphatase: 182 U/L — ABNORMAL HIGH (ref 39–117)
BUN: 15 mg/dL (ref 6–23)
CO2: 30 meq/L (ref 19–32)
Calcium: 9.3 mg/dL (ref 8.4–10.5)
Chloride: 99 meq/L (ref 96–112)
Creatinine, Ser: 0.79 mg/dL (ref 0.40–1.20)
GFR: 80.19 mL/min (ref >60.00)
Glucose, Bld: 90 mg/dL (ref 70–99)
Potassium: 4.1 meq/L (ref 3.5–5.1)
Sodium: 135 meq/L (ref 135–145)
Total Bilirubin: 0.4 mg/dL (ref 0.2–1.2)
Total Protein: 8.1 g/dL (ref 6.0–8.3)

## 2023-09-23 LAB — IBC + FERRITIN
Ferritin: 13.8 ng/mL (ref 10.0–291.0)
Iron: 39 ug/dL — ABNORMAL LOW (ref 42–145)
Saturation Ratios: 9.3 % — ABNORMAL LOW (ref 20.0–50.0)
TIBC: 420 ug/dL (ref 250.0–450.0)
Transferrin: 300 mg/dL (ref 212.0–360.0)

## 2023-09-23 MED ORDER — OMEPRAZOLE 20 MG PO CPDR: 20.0000 mg | DELAYED_RELEASE_CAPSULE | Freq: Two times a day (BID) | ORAL | 5 refills | Status: AC

## 2023-09-23 NOTE — Patient Instructions (Addendum)
 _______________________________________________________  If your blood pressure at your visit was 140/90 or greater, please contact your primary care physician to follow up on this.  _______________________________________________________  If you are age 63 or older, your body mass index should be between 23-30. Your Body mass index is 48.35 kg/m. If this is out of the aforementioned range listed, please consider follow up with your Primary Care Provider.  If you are age 77 or younger, your body mass index should be between 19-25. Your Body mass index is 48.35 kg/m. If this is out of the aformentioned range listed, please consider follow up with your Primary Care Provider.   ________________________________________________________  The  GI providers would like to encourage you to use MYCHART to communicate with providers for non-urgent requests or questions.  Due to long hold times on the telephone, sending your provider a message by [facility] may be a faster and more efficient way to get a response.  Please allow 48 business hours for a response.  Please remember that this is for non-urgent requests.  _______________________________________________________  Your provider has requested that you go to the basement level for lab work before leaving today. Press "B" on the elevator. The lab is located at the first door on the left as you exit the elevator.  We have sent the following medications to your pharmacy for you to pick up at your convenience: Omeprazole  20mg  2 times a day   You have been scheduled for a CT scan of the abdomen and pelvis at [facility] [place], [place], [place] [address]).   You are scheduled on 09-29-23 at 430pm. You should arrive by 215pm your appointment time for registration. Please follow the written instructions below on the day of your exam:  WARNING: IF YOU ARE ALLERGIC TO IODINE/X-RAY DYE, PLEASE NOTIFY RADIOLOGY IMMEDIATELY AT [phone]!  YOU WILL BE GIVEN A 13 HOUR PREMEDICATION PREP.  1) Do not eat or drink anything after 1230pm (4 hours prior to your test) 2) You will be given 2 bottles of oral contrast to drink on site.    Drink 1 bottle of contrast @ 230pm (2 hours prior to your exam)  Drink 1 bottle of contrast @  330pm (1 hour prior to your exam)  You may take any medications as prescribed with a small amount of water, if necessary. If you take any of the following medications: METFORMIN, GLUCOPHAGE, GLUCOVANCE, AVANDAMET, RIOMET, FORTAMET, ACTOPLUS MET, JANUMET, GLUMETZA or METAGLIP, you MAY be asked to HOLD this medication 48 hours AFTER the exam.  The purpose of you drinking the oral contrast is to aid in the visualization of your intestinal tract. The contrast solution may cause some diarrhea. Depending on your individual set of symptoms, you may also receive an intravenous injection of x-ray contrast/dye. Plan on being at [facility] for 30 minutes or longer, depending on the type of exam you are having performed.  This test typically takes 30-45 minutes to complete.  If you have any questions regarding your exam or if you need to reschedule, you may call the CT department at [phone] between the hours of 8:00 am and 5:00 pm, Monday-Friday.  You have been scheduled for an endoscopy. Please follow written instructions given to you at your visit today.  If you use inhalers (even only as needed), please bring them with you on the day of your procedure.  If you take any of the following medications, they will need to be adjusted prior to your  procedure:   DO NOT TAKE 7 DAYS PRIOR TO TEST- Trulicity (dulaglutide) Ozempic, Wegovy (semaglutide) Mounjaro (tirzepatide) Bydureon Bcise (exanatide extended release)  DO NOT TAKE 1 DAY PRIOR TO YOUR TEST Rybelsus (semaglutide) Adlyxin (lixisenatide) Victoza (liraglutide) Byetta  (exanatide) ___________________________________________________________________________  .thx

## 2023-09-23 NOTE — Progress Notes (Signed)
 Chief Complaint:   Referring Provider:  Orin Birk, MD      ASSESSMENT AND PLAN;   #1. IDA, neg colon 11/2022.   #2. GERD with occ dysphagia  #3. Abn alk phos with hepatomegaly.  Plan: -CBC, CMP, iron studies, celiac screen -Increase omeprazole  20mg  po BID #60, 11RF -hemoccult cards x 3 -EGD -CT scan abdo/pelvis. -Minimize nonsteroidals. - If still with problems, further workup.    HPI:    Jessica Rosales is a 63 y.o. female  With history of anxiety, osteoarthritis status post right knee replacement, GERD, obesity, HTN, hysterectomy  With IDA  Discussed the use of AI scribe software for clinical note transcription with the patient, who gave verbal consent to proceed.  History of Present Illness Jessica Rosales "Jessica Rosales" is a 63 year old female with anemia who presents with low hemoglobin levels.  She has experienced a decrease in hemoglobin levels, with a recent measurement of 9.6, down from 11 seven months ago. She experiences fatigue and has had epistaxis in the past, though not recently. She sometimes has bleeding gums when brushing her teeth. No recent weight loss, but she has gained about 20 pounds recently and experiences swelling.  She has a history of gastroesophageal reflux disease (GERD) and takes omeprazole  20 mg daily. If she misses a dose, her heartburn worsens. She sometimes feels like food gets stuck when swallowing, which has been more frequent lately. No nausea or vomiting, but she reports occasional constipation, which is more frequent than diarrhea.  Her past surgical history includes wrist surgery and a knee replacement last year, with no major complications or significant blood loss. She has not had a CT scan of her abdomen or an endoscopy, but she has had a colonoscopy which was normal except for hemorrhoids.  She was previously on Celebrex for arthritis but was switched to Relafen, which she tolerates well without any gastrointestinal side  effects. No black stools or significant abdominal pain. No history of heart or lung problems.   Past GI workup:   Colon 11/2022 - One 6 mm polyp in the cecum, removed with a cold snare. Resected and retrieved. - Medium- sized lipoma in the mid ascending colon. - Mild sigmoid diverticulosis - Non- bleeding internal hemorrhoids. - The examination was otherwise normal on direct and retroflexion views. -Bx: TA. Rpt 7 yrs. Past Medical History:  Diagnosis Date   Allergy    Anxiety    Arthritis    Depression    GERD (gastroesophageal reflux disease)    Hypertension     Past Surgical History:  Procedure Laterality Date   BUNIONECTOMY     HARDWARE REMOVAL Right 09/03/2022   Procedure: HARDWARE REMOVAL RIGHT WRIST;  Surgeon: Brunilda Capra, MD;  Location: [facility];  Service: Orthopedics;  Laterality: Right;  120 MIN   HERNIA REPAIR     HYSTERECTOMY ABDOMINAL WITH SALPINGECTOMY     ORIF DISTAL RADIUS FRACTURE     Total Knee     WRIST FRACTURE SURGERY     WRIST OSTEOTOMY Right 09/03/2022   Procedure: OPENING WEDGE OSTEOTOMY RIGHT DISTAL RADIUS WITH BONE GRAFT AND PLATING;  Surgeon: Brunilda Capra, MD;  Location:  SURGERY CENTER;  Service: Orthopedics;  Laterality: Right;    Family History  Problem Relation Age of Onset   Breast cancer Neg Hx    Colon polyps Neg Hx    Crohn's disease Neg Hx    Esophageal cancer Neg Hx    Rectal  cancer Neg Hx    Stomach cancer Neg Hx     Social History   Tobacco Use   Smoking status: Never   Smokeless tobacco: Never  Vaping Use   Vaping status: Never Used  Substance Use Topics   Alcohol  use: Never   Drug use: Never    Current Outpatient Medications  Medication Sig Dispense Refill   cyclobenzaprine (FLEXERIL) 10 MG tablet Take 10 mg by mouth 3 (three) times daily as needed for muscle spasms.     fluticasone (FLONASE) 50 MCG/ACT nasal spray Place 1 spray into both nostrils daily.     hydrochlorothiazide (HYDRODIURIL) 25  MG tablet Take 25 mg by mouth daily.     lisinopril (ZESTRIL) 30 MG tablet Take 30 mg by mouth daily.     Magnesium Oxide (MAG-200 PO) Take by mouth.     metoprolol succinate (TOPROL-XL) 25 MG 24 hr tablet Take 25 mg by mouth daily.     omeprazole  (PRILOSEC) 20 MG capsule Take 20 mg by mouth daily.     oxybutynin (DITROPAN-XL) 5 MG 24 hr tablet Take 1 tablet by mouth daily.     Potassium 99 MG TABS Take by mouth.     pramipexole (MIRAPEX) 1 MG tablet Take 1 mg by mouth 3 (three) times daily.     celecoxib (CELEBREX) 200 MG capsule Take 200 mg by mouth daily. (Patient not taking: Reported on 09/23/2023)     celecoxib (CELEBREX) 200 MG capsule  (Patient not taking: Reported on 09/23/2023)     oxycodone  (OXY-IR) 5 MG capsule Take 5 mg by mouth every 4 (four) hours as needed. (Patient not taking: Reported on 09/23/2023)     oxyCODONE -acetaminophen  (PERCOCET) 5-325 MG tablet 1-2 tabs PO q6 hours prn pain (Patient not taking: Reported on 09/23/2023) 25 tablet 0   vortioxetine HBr (TRINTELLIX) 20 MG TABS tablet Take 20 mg by mouth daily. (Patient not taking: Reported on 09/23/2023)     No current facility-administered medications for this visit.    No Known Allergies  Review of Systems:  Constitutional: Denies fever, chills, diaphoresis, appetite change and fatigue.  HEENT: Denies photophobia, eye pain, redness, hearing loss, ear pain, congestion, sore throat, rhinorrhea, sneezing, mouth sores, neck pain, neck stiffness and tinnitus.   Respiratory: Denies SOB, DOE, cough, chest tightness,  and wheezing.   Cardiovascular: Denies chest pain, palpitations and leg swelling.  Genitourinary: Denies dysuria, urgency, frequency, hematuria, flank pain and difficulty urinating.  Musculoskeletal: has myalgias, back pain, joint swelling, arthralgias and gait problem.  Skin: No rash.  Neurological: Denies dizziness, seizures, syncope, weakness, light-headedness, numbness and headaches.  Hematological: Denies  adenopathy. Easy bruising, personal or family bleeding history  Psychiatric/Behavioral: Has anxiety or depression     Physical Exam:    BP 126/72   Pulse 69   Ht 5\' 8"  (1.727 m)   Wt (!) 318 lb (144.2 kg)   BMI 48.35 kg/m  Wt Readings from Last 3 Encounters:  09/23/23 (!) 318 lb (144.2 kg)  11/24/22 300 lb (136.1 kg)  10/26/22 300 lb (136.1 kg)   Constitutional:  Well-developed, in no acute distress. Psychiatric: Normal mood and affect. Behavior is normal. HEENT: Pupils normal.  Conjunctivae are normal. No scleral icterus. Cardiovascular: Normal rate, regular rhythm. No edema Pulmonary/chest: Effort normal and breath sounds normal. No wheezing, rales or rhonchi. Abdominal: Soft, nondistended. Nontender. Bowel sounds active throughout. There are no masses palpable.  Liver palpated 4 cm below the costal margin.  Minimally tender. Rectal: Deferred  Neurological: Alert and oriented to person place and time. Skin: Skin is warm and dry. No rashes noted.  Data Reviewed: I have personally reviewed following labs and imaging studies Reviewed CBC, CMP on patient's phone.   Magnus Schuller, MD 09/23/2023, 10:37 AM  Cc: Orin Birk, MD

## 2023-09-26 LAB — CELIAC PANEL 10
Antigliadin Abs, IgA: 4 U (ref 0–19)
Gliadin IgG: 1 U (ref 0–19)
IgA/Immunoglobulin A, Serum: 367 mg/dL — ABNORMAL HIGH (ref 87–352)
Tissue Transglut Ab: 4 U/mL (ref 0–5)

## 2023-09-27 ENCOUNTER — Telehealth: Payer: Self-pay

## 2023-09-27 NOTE — Telephone Encounter (Addendum)
 Patient wants to know for the over the counter iron supplement should she take?    I did tell her a 325mg  more than likely to take but it can cause constipation

## 2023-09-28 NOTE — Telephone Encounter (Signed)
 Patient made aware and understand to call with any questions or concerns while taking the iron supplement

## 2023-09-29 ENCOUNTER — Ambulatory Visit (HOSPITAL_COMMUNITY)
Admission: RE | Admit: 2023-09-29 | Discharge: 2023-09-29 | Disposition: A | Discharge status: Home / Self Care | Source: Ambulatory Visit | Attending: Gastroenterology | Admitting: Gastroenterology

## 2023-09-29 DIAGNOSIS — R748 Abnormal levels of other serum enzymes: Secondary | ICD-10-CM | POA: Insufficient documentation

## 2023-09-29 DIAGNOSIS — K769 Liver disease, unspecified: Secondary | ICD-10-CM | POA: Diagnosis not present

## 2023-09-29 DIAGNOSIS — D509 Iron deficiency anemia, unspecified: Secondary | ICD-10-CM | POA: Diagnosis not present

## 2023-09-29 DIAGNOSIS — K8020 Calculus of gallbladder without cholecystitis without obstruction: Secondary | ICD-10-CM | POA: Diagnosis not present

## 2023-09-29 MED ORDER — IOHEXOL 9 MG/ML PO SOLN
1000.0000 mL | ORAL | Status: AC
  Administered 2023-09-29: 1000 mL via ORAL

## 2023-09-29 MED ORDER — IOHEXOL 9 MG/ML PO SOLN
ORAL | Status: AC
  Filled 2023-09-29: qty 1000

## 2023-09-29 MED ORDER — IOHEXOL 300 MG/ML  SOLN
100.0000 mL | Freq: Once | INTRAMUSCULAR | Status: AC | PRN
  Administered 2023-09-29: 100 mL via INTRAVENOUS

## 2023-09-30 DIAGNOSIS — G4730 Sleep apnea, unspecified: Secondary | ICD-10-CM | POA: Diagnosis not present

## 2023-09-30 DIAGNOSIS — M47816 Spondylosis without myelopathy or radiculopathy, lumbar region: Secondary | ICD-10-CM | POA: Diagnosis not present

## 2023-10-04 ENCOUNTER — Ambulatory Visit (INDEPENDENT_AMBULATORY_CARE_PROVIDER_SITE_OTHER)

## 2023-10-04 DIAGNOSIS — D509 Iron deficiency anemia, unspecified: Secondary | ICD-10-CM

## 2023-10-05 ENCOUNTER — Ambulatory Visit: Payer: Self-pay | Admitting: Gastroenterology

## 2023-10-05 LAB — HEMOCCULT SLIDES (X 3 CARDS)
Fecal Occult Blood: NEGATIVE
OCCULT 1: NEGATIVE
OCCULT 2: NEGATIVE
OCCULT 3: NEGATIVE
OCCULT 4: NEGATIVE
OCCULT 5: NEGATIVE

## 2023-10-10 DIAGNOSIS — G4733 Obstructive sleep apnea (adult) (pediatric): Secondary | ICD-10-CM | POA: Diagnosis not present

## 2023-10-12 DIAGNOSIS — G4733 Obstructive sleep apnea (adult) (pediatric): Secondary | ICD-10-CM | POA: Diagnosis not present

## 2023-10-14 DIAGNOSIS — M47816 Spondylosis without myelopathy or radiculopathy, lumbar region: Secondary | ICD-10-CM | POA: Diagnosis not present

## 2023-10-15 DIAGNOSIS — L0390 Cellulitis, unspecified: Secondary | ICD-10-CM | POA: Diagnosis not present

## 2023-10-21 DIAGNOSIS — L03119 Cellulitis of unspecified part of limb: Secondary | ICD-10-CM | POA: Diagnosis not present

## 2023-11-09 DIAGNOSIS — M47816 Spondylosis without myelopathy or radiculopathy, lumbar region: Secondary | ICD-10-CM | POA: Diagnosis not present

## 2023-11-10 ENCOUNTER — Encounter: Payer: Self-pay | Admitting: Gastroenterology

## 2023-11-15 ENCOUNTER — Encounter: Payer: Self-pay | Admitting: Gastroenterology

## 2023-11-15 NOTE — Telephone Encounter (Signed)
 Pt stated that she has been off her Relafen/ [place] for 4 days now that she takes for her arthritis and she is miserable. Pt stated that she has been taking it twice a day since October.  Pt does have a procedure ( EGD) that is scheduled for Wednesday 11/17/2023. Please review and advise.

## 2023-11-16 NOTE — Telephone Encounter (Signed)
Pt made aware of recent results and Dr. Gupta recommendations: Pt verbalized understanding with all questions answered.   

## 2023-11-16 NOTE — Telephone Encounter (Signed)
 Will let her know after EGD RG

## 2023-11-17 ENCOUNTER — Ambulatory Visit: Admitting: Gastroenterology

## 2023-11-17 ENCOUNTER — Encounter: Payer: Self-pay | Admitting: Gastroenterology

## 2023-11-17 VITALS — BP 123/69 | HR 78 | Temp 97.9°F | Resp 21 | Ht 68.0 in | Wt 318.0 lb

## 2023-11-17 DIAGNOSIS — Q399 Congenital malformation of esophagus, unspecified: Secondary | ICD-10-CM | POA: Diagnosis not present

## 2023-11-17 DIAGNOSIS — K219 Gastro-esophageal reflux disease without esophagitis: Secondary | ICD-10-CM | POA: Diagnosis not present

## 2023-11-17 DIAGNOSIS — K449 Diaphragmatic hernia without obstruction or gangrene: Secondary | ICD-10-CM | POA: Diagnosis not present

## 2023-11-17 DIAGNOSIS — G4730 Sleep apnea, unspecified: Secondary | ICD-10-CM | POA: Diagnosis not present

## 2023-11-17 DIAGNOSIS — K317 Polyp of stomach and duodenum: Secondary | ICD-10-CM

## 2023-11-17 DIAGNOSIS — K2289 Other specified disease of esophagus: Secondary | ICD-10-CM

## 2023-11-17 DIAGNOSIS — I10 Essential (primary) hypertension: Secondary | ICD-10-CM | POA: Diagnosis not present

## 2023-11-17 DIAGNOSIS — K319 Disease of stomach and duodenum, unspecified: Secondary | ICD-10-CM

## 2023-11-17 DIAGNOSIS — F419 Anxiety disorder, unspecified: Secondary | ICD-10-CM | POA: Diagnosis not present

## 2023-11-17 DIAGNOSIS — F32A Depression, unspecified: Secondary | ICD-10-CM | POA: Diagnosis not present

## 2023-11-17 DIAGNOSIS — D509 Iron deficiency anemia, unspecified: Secondary | ICD-10-CM

## 2023-11-17 MED ORDER — SODIUM CHLORIDE 0.9 % IV SOLN: 500.0000 mL | Freq: Once | INTRAVENOUS | Status: DC

## 2023-11-17 NOTE — Op Note (Signed)
 [facility] Patient Name: Jessica Rosales Procedure Date: 11/17/2023 10:15 AM MRN: [id number] Endoscopist: Lynnie Bring , MD, [id number] Age: 63 Referring MD:  Date of Birth: 1961-04-21 Gender: Female Account #: [id number] Procedure:                Upper GI endoscopy Indications:              Iron deficiency anemia with heme neg stools,                            Dysphagia, GERD. Neg CT AP except for asymptomatic                            cholelithiasis. Medicines:                Monitored Anesthesia Care Procedure:                Pre-Anesthesia Assessment:                           - Prior to the procedure, a History and Physical                            was performed, and patient medications and                            allergies were reviewed. The patient's tolerance of                            previous anesthesia was also reviewed. The risks                            and benefits of the procedure and the sedation                            options and risks were discussed with the patient.                            All questions were answered, and informed consent                            was obtained. Prior Anticoagulants: The patient has                            taken no anticoagulant or antiplatelet agents. ASA                            Grade Assessment: III - A patient with severe                            systemic disease. After reviewing the risks and                            benefits, the patient was deemed in satisfactory  condition to undergo the procedure.                           After obtaining informed consent, the endoscope was                            passed under direct vision. Throughout the                            procedure, the patient's blood pressure, pulse, and                            oxygen saturations were monitored continuously. The                            Olympus Scope [id number] was  introduced through the                            mouth, and advanced to the second part of duodenum.                            The upper GI endoscopy was accomplished without                            difficulty. The patient tolerated the procedure                            well. Scope In: Scope Out: Findings:                 The examined esophagus was mildly tortuous.                            Biopsies were obtained from the proximal and distal                            esophagus with cold forceps for histology to r/o                            eosinophilic esophagitis. The scope was withdrawn.                            Dilation was performed with a Maloney dilator with                            mild resistance at 50 Fr.                           The Z-line was regular and was found 36 cm from the                            incisors.                           A small transient hiatal hernia was present.  Localized mild inflammation characterized by                            erosions, erythema and granularity was found in the                            gastric antrum. Biopsies were taken with a cold                            forceps for histology.                           Multiple (20-25) 4 to 8 mm sessile polyps with no                            bleeding and no stigmata of recent bleeding were                            found in the gastric body. Three polyps were                            removed with a cold snare. Resection and retrieval                            were complete.                           The examined duodenum was normal. Biopsies for                            histology were taken with a cold forceps for                            evaluation of celiac disease. Complications:            No immediate complications. Estimated Blood Loss:     Estimated blood loss: none. Impression:               - Small hiatal hernia.                            - Erosive gastritis. Biopsied.                           - Multiple gastric polyps. Resected and retrieved x                            3.                           - S/P esophageal dilatation. Recommendation:           - Patient has a contact number available for                            emergencies. The signs and symptoms of potential  delayed complications were discussed with the                            patient. Return to normal activities tomorrow.                            Written discharge instructions were provided to the                            patient.                           - Postdilatation diet.                           - Continue present medications.                           - Await pathology results.                           - Trend CBC                           - FU GI clinic in 6-8 weeks.                           - If absolutely needed, can resume nonsteroidals                            only on as-needed basis with meals. Would prefer                            Tylenol .                           - The findings and recommendations were discussed                            with the patient's family. Lynnie Bring, MD 11/17/2023 10:43:59 AM This report has been signed electronically.

## 2023-11-17 NOTE — Patient Instructions (Addendum)
-   Postdilatation diet. - Continue present medications. - Await pathology results. - Trend CBC - FU GI clinic in 6-8 weeks. - If absolutely needed, can resume nonsteroidals only on as-needed basis with meals. Would prefer Tylenol .  YOU HAD AN ENDOSCOPIC PROCEDURE TODAY AT THE  ENDOSCOPY CENTER:   Refer to the procedure report that was given to you for any specific questions about what was found during the examination.  If the procedure report does not answer your questions, please call your gastroenterologist to clarify.  If you requested that your care partner not be given the details of your procedure findings, then the procedure report has been included in a sealed envelope for you to review at your convenience later.  YOU SHOULD EXPECT: Some feelings of bloating in the abdomen. Passage of more gas than usual.  Walking can help get rid of the air that was put into your GI tract during the procedure and reduce the bloating. If you had a lower endoscopy (such as a colonoscopy or flexible sigmoidoscopy) you may notice spotting of blood in your stool or on the toilet paper. If you underwent a bowel prep for your procedure, you may not have a normal bowel movement for a few days.  Please Note:  You might notice some irritation and congestion in your nose or some drainage.  This is from the oxygen used during your procedure.  There is no need for concern and it should clear up in a day or so.  SYMPTOMS TO REPORT IMMEDIATELY:  Following upper endoscopy (EGD)  Vomiting of blood or coffee ground material  New chest pain or pain under the shoulder blades  Painful or persistently difficult swallowing  New shortness of breath  Fever of 100F or higher  Black, tarry-looking stools  For urgent or emergent issues, a gastroenterologist can be reached at any hour by calling [phone]. Do not use MyChart messaging for urgent concerns.    DIET:  We do recommend a small meal at first, but then  you may proceed to your regular diet.  Drink plenty of fluids but you should avoid alcoholic beverages for 24 hours.  ACTIVITY:  You should plan to take it easy for the rest of today and you should NOT DRIVE or use heavy machinery until tomorrow (because of the sedation medicines used during the test).    FOLLOW UP: Our staff will call the number listed on your records the next business day following your procedure.  We will call around 7:15- 8:00 am to check on you and address any questions or concerns that you may have regarding the information given to you following your procedure. If we do not reach you, we will leave a message.     If any biopsies were taken you will be contacted by phone or by letter within the next 1-3 weeks.  Please call us  at [phone] if you have not heard about the biopsies in 3 weeks.    SIGNATURES/CONFIDENTIALITY: You and/or your care partner have signed paperwork which will be entered into your electronic medical record.  These signatures attest to the fact that that the information above on your After Visit Summary has been reviewed and is understood.  Full responsibility of the confidentiality of this discharge information lies with you and/or your care-partner.

## 2023-11-17 NOTE — Progress Notes (Signed)
 Pt's states no medical or surgical changes since previsit or office visit.

## 2023-11-17 NOTE — Progress Notes (Signed)
 Chief Complaint:   Referring Provider:  Gayl Males, MD      ASSESSMENT AND PLAN;   #1. IDA, neg colon 11/2022.   #2. GERD with occ dysphagia  #3. Abn alk phos with hepatomegaly.  Plan: -CBC, CMP, iron studies, celiac screen -Increase omeprazole  20mg  po BID #60, 11RF -hemoccult cards x 3 -EGD -CT scan abdo/pelvis. -Minimize nonsteroidals. - If still with problems, further workup.    HPI:    Jessica Rosales is a 63 y.o. female  With history of anxiety, osteoarthritis status post right knee replacement, GERD, obesity, HTN, hysterectomy  With IDA  Discussed the use of AI scribe software for clinical note transcription with the patient, who gave verbal consent to proceed.  History of Present Illness Jessica Rosales is a 62 year old female with anemia who presents with low hemoglobin levels.  She has experienced a decrease in hemoglobin levels, with a recent measurement of 9.6, down from 11 seven months ago. She experiences fatigue and has had epistaxis in the past, though not recently. She sometimes has bleeding gums when brushing her teeth. No recent weight loss, but she has gained about 20 pounds recently and experiences swelling.  She has a history of gastroesophageal reflux disease (GERD) and takes omeprazole  20 mg daily. If she misses a dose, her heartburn worsens. She sometimes feels like food gets stuck when swallowing, which has been more frequent lately. No nausea or vomiting, but she reports occasional constipation, which is more frequent than diarrhea.  Her past surgical history includes wrist surgery and a knee replacement last year, with no major complications or significant blood loss. She has not had a CT scan of her abdomen or an endoscopy, but she has had a colonoscopy which was normal except for hemorrhoids.  She was previously on Celebrex for arthritis but was switched to Relafen, which she tolerates well without any gastrointestinal side  effects. No black stools or significant abdominal pain. No history of heart or lung problems.   Past GI workup:   Colon 11/2022 - One 6 mm polyp in the cecum, removed with a cold snare. Resected and retrieved. - Medium- sized lipoma in the mid ascending colon. - Mild sigmoid diverticulosis - Non- bleeding internal hemorrhoids. - The examination was otherwise normal on direct and retroflexion views. -Bx: TA. Rpt 7 yrs. Past Medical History:  Diagnosis Date   Allergy    Anxiety    Arthritis    Depression    GERD (gastroesophageal reflux disease)    Hypertension    Sleep apnea     Past Surgical History:  Procedure Laterality Date   BUNIONECTOMY     HARDWARE REMOVAL Right 09/03/2022   Procedure: HARDWARE REMOVAL RIGHT WRIST;  Surgeon: Murrell Drivers, MD;  Location: [facility];  Service: Orthopedics;  Laterality: Right;  120 MIN   HERNIA REPAIR     HYSTERECTOMY ABDOMINAL WITH SALPINGECTOMY     ORIF DISTAL RADIUS FRACTURE     Total Knee     WRIST FRACTURE SURGERY     WRIST OSTEOTOMY Right 09/03/2022   Procedure: OPENING WEDGE OSTEOTOMY RIGHT DISTAL RADIUS WITH BONE GRAFT AND PLATING;  Surgeon: Murrell Drivers, MD;  Location: [facility];  Service: Orthopedics;  Laterality: Right;    Family History  Problem Relation Age of Onset   Breast cancer Neg Hx    Colon polyps Neg Hx    Crohn's disease Neg Hx    Esophageal cancer Neg  Hx    Rectal cancer Neg Hx    Stomach cancer Neg Hx     Social History   Tobacco Use   Smoking status: Never   Smokeless tobacco: Never  Vaping Use   Vaping status: Never Used  Substance Use Topics   Alcohol  use: Never   Drug use: Never    Current Outpatient Medications  Medication Sig Dispense Refill   amoxicillin (AMOXIL) 500 MG capsule SMARTSIG:4 Capsule(s) By Mouth     ascorbic acid (VITAMIN C) 100 MG tablet Take 100 mg by mouth.     fluticasone (FLONASE) 50 MCG/ACT nasal spray Place 1 spray into both nostrils daily.      hydrochlorothiazide (HYDRODIURIL) 25 MG tablet Take 25 mg by mouth daily.     lisinopril (ZESTRIL) 30 MG tablet Take 30 mg by mouth daily.     Magnesium Oxide (MAG-200 PO) Take by mouth.     Melatonin 10 MG TABS Take 10 mg by mouth.     metoprolol succinate (TOPROL-XL) 25 MG 24 hr tablet Take 25 mg by mouth daily.     mupirocin ointment (BACTROBAN) 2 % SMARTSIG:sparingly Topical 3 Times Daily     nabumetone (RELAFEN) 500 MG tablet Take 500 mg by mouth 2 (two) times daily.     omeprazole  (PRILOSEC) 20 MG capsule Take 1 capsule (20 mg total) by mouth 2 (two) times daily before a meal. 180 capsule 5   oxybutynin (DITROPAN-XL) 5 MG 24 hr tablet Take 1 tablet by mouth daily.     oxyCODONE  (OXY IR/ROXICODONE ) 5 MG immediate release tablet Take 5 mg by mouth.     Potassium 99 MG TABS Take by mouth.     potassium gluconate 595 (99 K) MG TABS tablet Take 99 mg by mouth.     pramipexole (MIRAPEX) 1 MG tablet Take 1 mg by mouth 3 (three) times daily.     pravastatin (PRAVACHOL) 10 MG tablet Take 10 mg by mouth daily.     vortioxetine HBr (TRINTELLIX) 20 MG TABS tablet Take 20 mg by mouth daily.     cyclobenzaprine (FLEXERIL) 10 MG tablet Take 10 mg by mouth 3 (three) times daily as needed for muscle spasms.     Current Facility-Administered Medications  Medication Dose Route Frequency Provider Last Rate Last Admin   0.9 %  sodium chloride  infusion  500 mL Intravenous Once Charlanne Groom, MD        No Known Allergies  Review of Systems:  Constitutional: Denies fever, chills, diaphoresis, appetite change and fatigue.  HEENT: Denies photophobia, eye pain, redness, hearing loss, ear pain, congestion, sore throat, rhinorrhea, sneezing, mouth sores, neck pain, neck stiffness and tinnitus.   Respiratory: Denies SOB, DOE, cough, chest tightness,  and wheezing.   Cardiovascular: Denies chest pain, palpitations and leg swelling.  Genitourinary: Denies dysuria, urgency, frequency, hematuria, flank pain and  difficulty urinating.  Musculoskeletal: has myalgias, back pain, joint swelling, arthralgias and gait problem.  Skin: No rash.  Neurological: Denies dizziness, seizures, syncope, weakness, light-headedness, numbness and headaches.  Hematological: Denies adenopathy. Easy bruising, personal or family bleeding history  Psychiatric/Behavioral: Has anxiety or depression     Physical Exam:    BP (!) 167/58   Pulse (!) 57   Temp 97.9 F (36.6 C)   Ht 5' 8 (1.727 m)   Wt (!) 318 lb (144.2 kg)   SpO2 100%   BMI 48.35 kg/m  Wt Readings from Last 3 Encounters:  11/17/23 (!) 318 lb (144.2 kg)  09/23/23 (!) 318 lb (144.2 kg)  11/24/22 300 lb (136.1 kg)   Constitutional:  Well-developed, in no acute distress. Psychiatric: Normal mood and affect. Behavior is normal. HEENT: Pupils normal.  Conjunctivae are normal. No scleral icterus. Cardiovascular: Normal rate, regular rhythm. No edema Pulmonary/chest: Effort normal and breath sounds normal. No wheezing, rales or rhonchi. Abdominal: Soft, nondistended. Nontender. Bowel sounds active throughout. There are no masses palpable.  Liver palpated 4 cm below the costal margin.  Minimally tender. Rectal: Deferred Neurological: Alert and oriented to person place and time. Skin: Skin is warm and dry. No rashes noted.  Data Reviewed: I have personally reviewed following labs and imaging studies Reviewed CBC, CMP on patient's phone.   Anselm Bring, MD 11/17/2023, 10:16 AM  Cc: Gayl Males, MD

## 2023-11-17 NOTE — Progress Notes (Signed)
 Called to room to assist during endoscopic procedure.  Patient ID and intended procedure confirmed with present staff. Received instructions for my participation in the procedure from the performing physician.

## 2023-11-17 NOTE — Progress Notes (Signed)
 Report given to PACU, vss

## 2023-11-17 NOTE — Progress Notes (Signed)
1021 Robinul 0.1 mg IV given due large amount of secretions upon assessment.  MD made aware, vss 

## 2023-11-18 ENCOUNTER — Telehealth: Payer: Self-pay

## 2023-11-18 DIAGNOSIS — G4733 Obstructive sleep apnea (adult) (pediatric): Secondary | ICD-10-CM | POA: Diagnosis not present

## 2023-11-18 NOTE — Telephone Encounter (Signed)
 No answer after follow up call. Voice message left.

## 2023-11-19 LAB — SURGICAL PATHOLOGY

## 2023-11-21 DIAGNOSIS — G4733 Obstructive sleep apnea (adult) (pediatric): Secondary | ICD-10-CM | POA: Diagnosis not present

## 2023-11-25 ENCOUNTER — Ambulatory Visit: Payer: Self-pay | Admitting: Gastroenterology

## 2023-11-29 DIAGNOSIS — G2581 Restless legs syndrome: Secondary | ICD-10-CM | POA: Diagnosis not present

## 2023-11-29 DIAGNOSIS — M19031 Primary osteoarthritis, right wrist: Secondary | ICD-10-CM | POA: Diagnosis not present

## 2023-11-29 DIAGNOSIS — E7800 Pure hypercholesterolemia, unspecified: Secondary | ICD-10-CM | POA: Diagnosis not present

## 2023-11-29 DIAGNOSIS — R32 Unspecified urinary incontinence: Secondary | ICD-10-CM | POA: Diagnosis not present

## 2023-11-29 DIAGNOSIS — S52501A Unspecified fracture of the lower end of right radius, initial encounter for closed fracture: Secondary | ICD-10-CM | POA: Diagnosis not present

## 2023-11-29 DIAGNOSIS — Z6841 Body Mass Index (BMI) 40.0 and over, adult: Secondary | ICD-10-CM | POA: Diagnosis not present

## 2023-11-29 DIAGNOSIS — I10 Essential (primary) hypertension: Secondary | ICD-10-CM | POA: Diagnosis not present

## 2023-11-29 DIAGNOSIS — F329 Major depressive disorder, single episode, unspecified: Secondary | ICD-10-CM | POA: Diagnosis not present

## 2023-12-02 DIAGNOSIS — G4733 Obstructive sleep apnea (adult) (pediatric): Secondary | ICD-10-CM | POA: Diagnosis not present

## 2023-12-29 DIAGNOSIS — M47816 Spondylosis without myelopathy or radiculopathy, lumbar region: Secondary | ICD-10-CM | POA: Diagnosis not present

## 2023-12-30 DIAGNOSIS — T2124XA Burn of second degree of lower back, initial encounter: Secondary | ICD-10-CM | POA: Diagnosis not present

## 2023-12-30 DIAGNOSIS — L089 Local infection of the skin and subcutaneous tissue, unspecified: Secondary | ICD-10-CM | POA: Diagnosis not present

## 2023-12-30 DIAGNOSIS — M4306 Spondylolysis, lumbar region: Secondary | ICD-10-CM | POA: Diagnosis not present

## 2023-12-31 DIAGNOSIS — M4306 Spondylolysis, lumbar region: Secondary | ICD-10-CM | POA: Diagnosis not present

## 2023-12-31 DIAGNOSIS — G062 Extradural and subdural abscess, unspecified: Secondary | ICD-10-CM | POA: Diagnosis not present

## 2024-01-02 DIAGNOSIS — G4733 Obstructive sleep apnea (adult) (pediatric): Secondary | ICD-10-CM | POA: Diagnosis not present

## 2024-01-03 DIAGNOSIS — L03115 Cellulitis of right lower limb: Secondary | ICD-10-CM | POA: Diagnosis not present

## 2024-01-03 DIAGNOSIS — M6598 Unspecified synovitis and tenosynovitis, other site: Secondary | ICD-10-CM | POA: Diagnosis not present

## 2024-01-03 DIAGNOSIS — R0902 Hypoxemia: Secondary | ICD-10-CM | POA: Diagnosis not present

## 2024-01-03 DIAGNOSIS — Z452 Encounter for adjustment and management of vascular access device: Secondary | ICD-10-CM | POA: Diagnosis not present

## 2024-01-03 DIAGNOSIS — M7989 Other specified soft tissue disorders: Secondary | ICD-10-CM | POA: Diagnosis not present

## 2024-01-03 DIAGNOSIS — K219 Gastro-esophageal reflux disease without esophagitis: Secondary | ICD-10-CM | POA: Diagnosis not present

## 2024-01-03 DIAGNOSIS — G062 Extradural and subdural abscess, unspecified: Secondary | ICD-10-CM | POA: Diagnosis not present

## 2024-01-03 DIAGNOSIS — Z791 Long term (current) use of non-steroidal anti-inflammatories (NSAID): Secondary | ICD-10-CM | POA: Diagnosis not present

## 2024-01-03 DIAGNOSIS — E875 Hyperkalemia: Secondary | ICD-10-CM | POA: Diagnosis not present

## 2024-01-03 DIAGNOSIS — M48061 Spinal stenosis, lumbar region without neurogenic claudication: Secondary | ICD-10-CM | POA: Diagnosis not present

## 2024-01-03 DIAGNOSIS — S21209A Unspecified open wound of unspecified back wall of thorax without penetration into thoracic cavity, initial encounter: Secondary | ICD-10-CM | POA: Diagnosis not present

## 2024-01-03 DIAGNOSIS — L03116 Cellulitis of left lower limb: Secondary | ICD-10-CM | POA: Diagnosis not present

## 2024-01-03 DIAGNOSIS — N3281 Overactive bladder: Secondary | ICD-10-CM | POA: Diagnosis not present

## 2024-01-03 DIAGNOSIS — R9431 Abnormal electrocardiogram [ECG] [EKG]: Secondary | ICD-10-CM | POA: Diagnosis not present

## 2024-01-03 DIAGNOSIS — N179 Acute kidney failure, unspecified: Secondary | ICD-10-CM | POA: Diagnosis not present

## 2024-01-03 DIAGNOSIS — R0989 Other specified symptoms and signs involving the circulatory and respiratory systems: Secondary | ICD-10-CM | POA: Diagnosis not present

## 2024-01-03 DIAGNOSIS — M5116 Intervertebral disc disorders with radiculopathy, lumbar region: Secondary | ICD-10-CM | POA: Diagnosis not present

## 2024-01-03 DIAGNOSIS — Z96653 Presence of artificial knee joint, bilateral: Secondary | ICD-10-CM | POA: Diagnosis not present

## 2024-01-03 DIAGNOSIS — Z7985 Long-term (current) use of injectable non-insulin antidiabetic drugs: Secondary | ICD-10-CM | POA: Diagnosis not present

## 2024-01-03 DIAGNOSIS — G928 Other toxic encephalopathy: Secondary | ICD-10-CM | POA: Diagnosis not present

## 2024-01-03 DIAGNOSIS — M25572 Pain in left ankle and joints of left foot: Secondary | ICD-10-CM | POA: Diagnosis not present

## 2024-01-03 DIAGNOSIS — M4807 Spinal stenosis, lumbosacral region: Secondary | ICD-10-CM | POA: Diagnosis not present

## 2024-01-03 DIAGNOSIS — T426X5A Adverse effect of other antiepileptic and sedative-hypnotic drugs, initial encounter: Secondary | ICD-10-CM | POA: Diagnosis not present

## 2024-01-03 DIAGNOSIS — E861 Hypovolemia: Secondary | ICD-10-CM | POA: Diagnosis not present

## 2024-01-03 DIAGNOSIS — R339 Retention of urine, unspecified: Secondary | ICD-10-CM | POA: Diagnosis not present

## 2024-01-03 DIAGNOSIS — G834 Cauda equina syndrome: Secondary | ICD-10-CM | POA: Diagnosis not present

## 2024-01-03 DIAGNOSIS — M545 Low back pain, unspecified: Secondary | ICD-10-CM | POA: Diagnosis not present

## 2024-01-03 DIAGNOSIS — Z79899 Other long term (current) drug therapy: Secondary | ICD-10-CM | POA: Diagnosis not present

## 2024-01-03 DIAGNOSIS — M19072 Primary osteoarthritis, left ankle and foot: Secondary | ICD-10-CM | POA: Diagnosis not present

## 2024-01-03 DIAGNOSIS — M79672 Pain in left foot: Secondary | ICD-10-CM | POA: Diagnosis not present

## 2024-01-03 DIAGNOSIS — G061 Intraspinal abscess and granuloma: Secondary | ICD-10-CM | POA: Diagnosis not present

## 2024-01-03 DIAGNOSIS — E871 Hypo-osmolality and hyponatremia: Secondary | ICD-10-CM | POA: Diagnosis not present

## 2024-01-03 DIAGNOSIS — M7732 Calcaneal spur, left foot: Secondary | ICD-10-CM | POA: Diagnosis not present

## 2024-01-03 DIAGNOSIS — G253 Myoclonus: Secondary | ICD-10-CM | POA: Diagnosis not present

## 2024-01-03 DIAGNOSIS — Z87891 Personal history of nicotine dependence: Secondary | ICD-10-CM | POA: Diagnosis not present

## 2024-01-03 DIAGNOSIS — I10 Essential (primary) hypertension: Secondary | ICD-10-CM | POA: Diagnosis not present

## 2024-01-03 DIAGNOSIS — Z6841 Body Mass Index (BMI) 40.0 and over, adult: Secondary | ICD-10-CM | POA: Diagnosis not present

## 2024-01-12 DIAGNOSIS — I10 Essential (primary) hypertension: Secondary | ICD-10-CM | POA: Diagnosis not present

## 2024-01-12 DIAGNOSIS — D649 Anemia, unspecified: Secondary | ICD-10-CM | POA: Diagnosis not present

## 2024-01-12 DIAGNOSIS — M25551 Pain in right hip: Secondary | ICD-10-CM | POA: Diagnosis not present

## 2024-01-12 DIAGNOSIS — R531 Weakness: Secondary | ICD-10-CM | POA: Diagnosis not present

## 2024-01-12 DIAGNOSIS — G061 Intraspinal abscess and granuloma: Secondary | ICD-10-CM | POA: Diagnosis not present

## 2024-01-12 DIAGNOSIS — R609 Edema, unspecified: Secondary | ICD-10-CM | POA: Diagnosis not present

## 2024-01-12 DIAGNOSIS — Z79899 Other long term (current) drug therapy: Secondary | ICD-10-CM | POA: Diagnosis not present

## 2024-01-12 DIAGNOSIS — M25572 Pain in left ankle and joints of left foot: Secondary | ICD-10-CM | POA: Diagnosis not present

## 2024-01-12 DIAGNOSIS — G44209 Tension-type headache, unspecified, not intractable: Secondary | ICD-10-CM | POA: Diagnosis not present

## 2024-01-13 DIAGNOSIS — E871 Hypo-osmolality and hyponatremia: Secondary | ICD-10-CM | POA: Diagnosis not present

## 2024-01-13 DIAGNOSIS — G9341 Metabolic encephalopathy: Secondary | ICD-10-CM | POA: Diagnosis not present

## 2024-01-13 DIAGNOSIS — I10 Essential (primary) hypertension: Secondary | ICD-10-CM | POA: Diagnosis not present

## 2024-01-13 DIAGNOSIS — M4807 Spinal stenosis, lumbosacral region: Secondary | ICD-10-CM | POA: Diagnosis not present

## 2024-01-13 DIAGNOSIS — F32A Depression, unspecified: Secondary | ICD-10-CM | POA: Diagnosis not present

## 2024-01-13 DIAGNOSIS — N3281 Overactive bladder: Secondary | ICD-10-CM | POA: Diagnosis not present

## 2024-01-13 DIAGNOSIS — D72829 Elevated white blood cell count, unspecified: Secondary | ICD-10-CM | POA: Diagnosis not present

## 2024-01-13 DIAGNOSIS — K219 Gastro-esophageal reflux disease without esophagitis: Secondary | ICD-10-CM | POA: Diagnosis not present

## 2024-01-13 DIAGNOSIS — G8929 Other chronic pain: Secondary | ICD-10-CM | POA: Diagnosis not present

## 2024-01-13 DIAGNOSIS — M48061 Spinal stenosis, lumbar region without neurogenic claudication: Secondary | ICD-10-CM | POA: Diagnosis not present

## 2024-01-13 DIAGNOSIS — L03116 Cellulitis of left lower limb: Secondary | ICD-10-CM | POA: Diagnosis not present

## 2024-01-13 DIAGNOSIS — Z452 Encounter for adjustment and management of vascular access device: Secondary | ICD-10-CM | POA: Diagnosis not present

## 2024-01-13 DIAGNOSIS — G4730 Sleep apnea, unspecified: Secondary | ICD-10-CM | POA: Diagnosis not present

## 2024-01-13 DIAGNOSIS — G061 Intraspinal abscess and granuloma: Secondary | ICD-10-CM | POA: Diagnosis not present

## 2024-01-13 DIAGNOSIS — D509 Iron deficiency anemia, unspecified: Secondary | ICD-10-CM | POA: Diagnosis not present

## 2024-01-13 DIAGNOSIS — N179 Acute kidney failure, unspecified: Secondary | ICD-10-CM | POA: Diagnosis not present

## 2024-01-13 DIAGNOSIS — G834 Cauda equina syndrome: Secondary | ICD-10-CM | POA: Diagnosis not present

## 2024-01-13 DIAGNOSIS — L03115 Cellulitis of right lower limb: Secondary | ICD-10-CM | POA: Diagnosis not present

## 2024-01-13 DIAGNOSIS — M19072 Primary osteoarthritis, left ankle and foot: Secondary | ICD-10-CM | POA: Diagnosis not present

## 2024-01-13 DIAGNOSIS — M4657 Other infective spondylopathies, lumbosacral region: Secondary | ICD-10-CM | POA: Diagnosis not present

## 2024-01-13 DIAGNOSIS — M5116 Intervertebral disc disorders with radiculopathy, lumbar region: Secondary | ICD-10-CM | POA: Diagnosis not present

## 2024-01-13 DIAGNOSIS — E559 Vitamin D deficiency, unspecified: Secondary | ICD-10-CM | POA: Diagnosis not present

## 2024-01-13 DIAGNOSIS — Z6841 Body Mass Index (BMI) 40.0 and over, adult: Secondary | ICD-10-CM | POA: Diagnosis not present

## 2024-01-16 DIAGNOSIS — G061 Intraspinal abscess and granuloma: Secondary | ICD-10-CM | POA: Diagnosis not present

## 2024-01-17 DIAGNOSIS — G061 Intraspinal abscess and granuloma: Secondary | ICD-10-CM | POA: Diagnosis not present

## 2024-01-18 DIAGNOSIS — G062 Extradural and subdural abscess, unspecified: Secondary | ICD-10-CM | POA: Diagnosis not present

## 2024-01-20 DIAGNOSIS — G062 Extradural and subdural abscess, unspecified: Secondary | ICD-10-CM | POA: Diagnosis not present

## 2024-01-20 DIAGNOSIS — M6282 Rhabdomyolysis: Secondary | ICD-10-CM | POA: Diagnosis not present

## 2024-01-20 DIAGNOSIS — L03115 Cellulitis of right lower limb: Secondary | ICD-10-CM | POA: Diagnosis not present

## 2024-01-21 DIAGNOSIS — G062 Extradural and subdural abscess, unspecified: Secondary | ICD-10-CM | POA: Diagnosis not present

## 2024-01-21 DIAGNOSIS — G061 Intraspinal abscess and granuloma: Secondary | ICD-10-CM | POA: Diagnosis not present

## 2024-01-21 DIAGNOSIS — L03115 Cellulitis of right lower limb: Secondary | ICD-10-CM | POA: Diagnosis not present

## 2024-01-21 DIAGNOSIS — R748 Abnormal levels of other serum enzymes: Secondary | ICD-10-CM | POA: Diagnosis not present

## 2024-01-22 DIAGNOSIS — G061 Intraspinal abscess and granuloma: Secondary | ICD-10-CM | POA: Diagnosis not present

## 2024-01-25 DIAGNOSIS — M4657 Other infective spondylopathies, lumbosacral region: Secondary | ICD-10-CM | POA: Diagnosis not present

## 2024-01-25 DIAGNOSIS — N179 Acute kidney failure, unspecified: Secondary | ICD-10-CM | POA: Diagnosis not present

## 2024-01-25 DIAGNOSIS — B3731 Acute candidiasis of vulva and vagina: Secondary | ICD-10-CM | POA: Diagnosis not present

## 2024-01-25 DIAGNOSIS — G061 Intraspinal abscess and granuloma: Secondary | ICD-10-CM | POA: Diagnosis not present

## 2024-01-26 DIAGNOSIS — G060 Intracranial abscess and granuloma: Secondary | ICD-10-CM | POA: Diagnosis not present

## 2024-01-26 DIAGNOSIS — N179 Acute kidney failure, unspecified: Secondary | ICD-10-CM | POA: Diagnosis not present

## 2024-01-27 DIAGNOSIS — L03115 Cellulitis of right lower limb: Secondary | ICD-10-CM | POA: Diagnosis not present

## 2024-01-27 DIAGNOSIS — L03116 Cellulitis of left lower limb: Secondary | ICD-10-CM | POA: Diagnosis not present

## 2024-01-27 DIAGNOSIS — G061 Intraspinal abscess and granuloma: Secondary | ICD-10-CM | POA: Diagnosis not present

## 2024-01-27 DIAGNOSIS — M4657 Other infective spondylopathies, lumbosacral region: Secondary | ICD-10-CM | POA: Diagnosis not present

## 2024-01-28 DIAGNOSIS — G061 Intraspinal abscess and granuloma: Secondary | ICD-10-CM | POA: Diagnosis not present

## 2024-01-28 DIAGNOSIS — L03115 Cellulitis of right lower limb: Secondary | ICD-10-CM | POA: Diagnosis not present

## 2024-01-31 ENCOUNTER — Ambulatory Visit: Admitting: Gastroenterology

## 2024-02-01 DIAGNOSIS — L03115 Cellulitis of right lower limb: Secondary | ICD-10-CM | POA: Diagnosis not present

## 2024-02-01 DIAGNOSIS — G061 Intraspinal abscess and granuloma: Secondary | ICD-10-CM | POA: Diagnosis not present

## 2024-02-01 DIAGNOSIS — M6282 Rhabdomyolysis: Secondary | ICD-10-CM | POA: Diagnosis not present

## 2024-02-01 DIAGNOSIS — L0390 Cellulitis, unspecified: Secondary | ICD-10-CM | POA: Diagnosis not present

## 2024-02-08 DIAGNOSIS — L03119 Cellulitis of unspecified part of limb: Secondary | ICD-10-CM | POA: Diagnosis not present

## 2024-02-09 DIAGNOSIS — H35443 Age-related reticular degeneration of retina, bilateral: Secondary | ICD-10-CM | POA: Diagnosis not present

## 2024-02-09 DIAGNOSIS — H524 Presbyopia: Secondary | ICD-10-CM | POA: Diagnosis not present

## 2024-02-09 DIAGNOSIS — D3132 Benign neoplasm of left choroid: Secondary | ICD-10-CM | POA: Diagnosis not present

## 2024-02-09 DIAGNOSIS — H43811 Vitreous degeneration, right eye: Secondary | ICD-10-CM | POA: Diagnosis not present

## 2024-02-28 DIAGNOSIS — G039 Meningitis, unspecified: Secondary | ICD-10-CM | POA: Diagnosis not present

## 2024-02-28 DIAGNOSIS — G061 Intraspinal abscess and granuloma: Secondary | ICD-10-CM | POA: Diagnosis not present

## 2024-02-28 DIAGNOSIS — M6282 Rhabdomyolysis: Secondary | ICD-10-CM | POA: Diagnosis not present

## 2024-02-28 DIAGNOSIS — L03115 Cellulitis of right lower limb: Secondary | ICD-10-CM | POA: Diagnosis not present

## 2024-03-02 DIAGNOSIS — Z0000 Encounter for general adult medical examination without abnormal findings: Secondary | ICD-10-CM | POA: Diagnosis not present

## 2024-03-02 DIAGNOSIS — F330 Major depressive disorder, recurrent, mild: Secondary | ICD-10-CM | POA: Diagnosis not present

## 2024-03-02 DIAGNOSIS — Z23 Encounter for immunization: Secondary | ICD-10-CM | POA: Diagnosis not present

## 2024-03-02 DIAGNOSIS — Z2821 Immunization not carried out because of patient refusal: Secondary | ICD-10-CM | POA: Diagnosis not present

## 2024-03-02 DIAGNOSIS — Z90710 Acquired absence of both cervix and uterus: Secondary | ICD-10-CM | POA: Diagnosis not present

## 2024-03-02 DIAGNOSIS — Z79899 Other long term (current) drug therapy: Secondary | ICD-10-CM | POA: Diagnosis not present

## 2024-03-02 DIAGNOSIS — K760 Fatty (change of) liver, not elsewhere classified: Secondary | ICD-10-CM | POA: Diagnosis not present

## 2024-03-02 DIAGNOSIS — Z1389 Encounter for screening for other disorder: Secondary | ICD-10-CM | POA: Diagnosis not present

## 2024-03-02 DIAGNOSIS — Z6841 Body Mass Index (BMI) 40.0 and over, adult: Secondary | ICD-10-CM | POA: Diagnosis not present

## 2024-03-02 DIAGNOSIS — D649 Anemia, unspecified: Secondary | ICD-10-CM | POA: Diagnosis not present

## 2024-03-02 DIAGNOSIS — R945 Abnormal results of liver function studies: Secondary | ICD-10-CM | POA: Diagnosis not present

## 2024-03-03 DIAGNOSIS — Z23 Encounter for immunization: Secondary | ICD-10-CM | POA: Diagnosis not present

## 2024-03-15 DIAGNOSIS — D3132 Benign neoplasm of left choroid: Secondary | ICD-10-CM | POA: Diagnosis not present

## 2024-03-15 DIAGNOSIS — H4311 Vitreous hemorrhage, right eye: Secondary | ICD-10-CM | POA: Diagnosis not present

## 2024-03-15 DIAGNOSIS — H33331 Multiple defects of retina without detachment, right eye: Secondary | ICD-10-CM | POA: Diagnosis not present

## 2024-03-15 DIAGNOSIS — H43811 Vitreous degeneration, right eye: Secondary | ICD-10-CM | POA: Diagnosis not present

## 2024-03-15 DIAGNOSIS — H43822 Vitreomacular adhesion, left eye: Secondary | ICD-10-CM | POA: Diagnosis not present

## 2024-03-21 ENCOUNTER — Other Ambulatory Visit: Payer: Self-pay | Admitting: Family Medicine

## 2024-03-21 DIAGNOSIS — Z1231 Encounter for screening mammogram for malignant neoplasm of breast: Secondary | ICD-10-CM

## 2024-03-24 DIAGNOSIS — E7800 Pure hypercholesterolemia, unspecified: Secondary | ICD-10-CM | POA: Diagnosis not present

## 2024-03-24 DIAGNOSIS — Z79899 Other long term (current) drug therapy: Secondary | ICD-10-CM | POA: Diagnosis not present

## 2024-03-24 DIAGNOSIS — K219 Gastro-esophageal reflux disease without esophagitis: Secondary | ICD-10-CM | POA: Diagnosis not present

## 2024-03-24 DIAGNOSIS — I10 Essential (primary) hypertension: Secondary | ICD-10-CM | POA: Diagnosis not present

## 2024-03-30 DIAGNOSIS — J0100 Acute maxillary sinusitis, unspecified: Secondary | ICD-10-CM | POA: Diagnosis not present

## 2024-03-31 DIAGNOSIS — H33322 Round hole, left eye: Secondary | ICD-10-CM | POA: Diagnosis not present

## 2024-03-31 DIAGNOSIS — D3132 Benign neoplasm of left choroid: Secondary | ICD-10-CM | POA: Diagnosis not present

## 2024-03-31 DIAGNOSIS — H43811 Vitreous degeneration, right eye: Secondary | ICD-10-CM | POA: Diagnosis not present

## 2024-03-31 DIAGNOSIS — H31091 Other chorioretinal scars, right eye: Secondary | ICD-10-CM | POA: Diagnosis not present

## 2024-03-31 DIAGNOSIS — H4311 Vitreous hemorrhage, right eye: Secondary | ICD-10-CM | POA: Diagnosis not present

## 2024-03-31 DIAGNOSIS — H43822 Vitreomacular adhesion, left eye: Secondary | ICD-10-CM | POA: Diagnosis not present

## 2024-04-17 DIAGNOSIS — J0100 Acute maxillary sinusitis, unspecified: Secondary | ICD-10-CM | POA: Diagnosis not present

## 2024-04-23 DIAGNOSIS — E7800 Pure hypercholesterolemia, unspecified: Secondary | ICD-10-CM | POA: Diagnosis not present

## 2024-04-23 DIAGNOSIS — Z79899 Other long term (current) drug therapy: Secondary | ICD-10-CM | POA: Diagnosis not present

## 2024-04-23 DIAGNOSIS — I10 Essential (primary) hypertension: Secondary | ICD-10-CM | POA: Diagnosis not present

## 2024-04-23 DIAGNOSIS — M545 Low back pain, unspecified: Secondary | ICD-10-CM | POA: Diagnosis not present

## 2024-04-23 DIAGNOSIS — F329 Major depressive disorder, single episode, unspecified: Secondary | ICD-10-CM | POA: Diagnosis not present

## 2024-04-28 DIAGNOSIS — H33322 Round hole, left eye: Secondary | ICD-10-CM | POA: Diagnosis not present

## 2024-05-05 ENCOUNTER — Ambulatory Visit
Admission: RE | Admit: 2024-05-05 | Discharge: 2024-05-05 | Disposition: A | Source: Ambulatory Visit | Attending: Family Medicine | Admitting: Family Medicine

## 2024-05-05 DIAGNOSIS — Z1231 Encounter for screening mammogram for malignant neoplasm of breast: Secondary | ICD-10-CM

## 2024-06-02 ENCOUNTER — Other Ambulatory Visit: Payer: Self-pay | Admitting: Orthopedic Surgery

## 2024-07-20 ENCOUNTER — Encounter (HOSPITAL_BASED_OUTPATIENT_CLINIC_OR_DEPARTMENT_OTHER): Payer: Self-pay

## 2024-07-20 ENCOUNTER — Ambulatory Visit (HOSPITAL_BASED_OUTPATIENT_CLINIC_OR_DEPARTMENT_OTHER): Admit: 2024-07-20 | Admitting: Orthopedic Surgery
